# Patient Record
Sex: Female | Born: 1950
Health system: Southern US, Community
[De-identification: ages and names within clinical notes are randomized; demographics above are authoritative.]

## PROBLEM LIST (undated history)

## (undated) DIAGNOSIS — R079 Chest pain, unspecified: Secondary | ICD-10-CM

## (undated) DIAGNOSIS — E785 Hyperlipidemia, unspecified: Secondary | ICD-10-CM

## (undated) DIAGNOSIS — M1990 Unspecified osteoarthritis, unspecified site: Secondary | ICD-10-CM

## (undated) DIAGNOSIS — R0602 Shortness of breath: Secondary | ICD-10-CM

## (undated) HISTORY — PX: NASAL RECONSTRUCTION: SHX2069

## (undated) HISTORY — DX: Shortness of breath: R06.02

## (undated) HISTORY — DX: Hyperlipidemia, unspecified: E78.5

## (undated) HISTORY — PX: TONSILLECTOMY: SUR1361

## (undated) HISTORY — PX: TUBAL LIGATION: SHX77

## (undated) HISTORY — DX: Chest pain, unspecified: R07.9

## (undated) HISTORY — PX: BREAST ENHANCEMENT SURGERY: SHX7

## (undated) HISTORY — PX: ANAL FISSURE REPAIR: SHX2312

---

## 2013-01-08 DIAGNOSIS — C4492 Squamous cell carcinoma of skin, unspecified: Secondary | ICD-10-CM

## 2013-01-08 HISTORY — DX: Squamous cell carcinoma of skin, unspecified: C44.92

## 2013-09-14 DIAGNOSIS — D239 Other benign neoplasm of skin, unspecified: Secondary | ICD-10-CM

## 2013-09-14 HISTORY — DX: Other benign neoplasm of skin, unspecified: D23.9

## 2013-09-29 ENCOUNTER — Encounter: Payer: Self-pay | Admitting: Cardiology

## 2013-09-29 ENCOUNTER — Ambulatory Visit (INDEPENDENT_AMBULATORY_CARE_PROVIDER_SITE_OTHER): Payer: BC Managed Care – PPO | Admitting: Cardiology

## 2013-09-29 ENCOUNTER — Encounter (INDEPENDENT_AMBULATORY_CARE_PROVIDER_SITE_OTHER): Payer: Self-pay

## 2013-09-29 VITALS — BP 122/80 | HR 76 | Ht 67.0 in | Wt 160.0 lb

## 2013-09-29 DIAGNOSIS — Z8249 Family history of ischemic heart disease and other diseases of the circulatory system: Secondary | ICD-10-CM

## 2013-09-29 DIAGNOSIS — F17200 Nicotine dependence, unspecified, uncomplicated: Secondary | ICD-10-CM

## 2013-09-29 DIAGNOSIS — Z720 Tobacco use: Secondary | ICD-10-CM

## 2013-09-29 DIAGNOSIS — E7849 Other hyperlipidemia: Secondary | ICD-10-CM

## 2013-09-29 DIAGNOSIS — E785 Hyperlipidemia, unspecified: Secondary | ICD-10-CM

## 2013-09-29 DIAGNOSIS — E7800 Pure hypercholesterolemia, unspecified: Secondary | ICD-10-CM

## 2013-09-29 NOTE — Patient Instructions (Signed)
Your physician wants you to follow-up in: [place] will receive a reminder letter in the mail two months in advance. If you don't receive a letter, please call our office to schedule the follow-up appointment.

## 2013-09-29 NOTE — Progress Notes (Signed)
[facility]. [address], [address] [place], [place]  [address] Phone: [phone] Fax:  [phone]  Date:  09/29/2013   ID:  Monica Franklin, DOB January 05, 1951, MRN [id number]  PCP:  Gara Kroner, MD   History of Present Illness: Monica Franklin is a 63 y.o. female here for evaluation of lipids, peripheral to lipid clinic. Dr. Moreen Fowler has been working on lowering cholesterol with low cholesterol diet and despite this, her LDL has climbed to 201 up to current level of 205. He clearly explained to her that this places her at high risk of developmental plaque that could lead to heart attack or stroke. Significant genetic predisposition, likely familial hyperlipidemia. He recommends starting with 40 mg of atorvastatin. She is reluctant to use statin medication and desired consultation here.  Labs: 08/31/13-total cholesterol 283, triglycerides 151, HDL 48, LDL 205  Father died of a heart attack at age 43. Mother died at age 91 from colon cancer. Has a sister with lung cancer and brain cancer. She is a smoker, half pack per day which she has recently quit.  She denies any chest pain, shortness of breath. She exercises with yoga once twice a week, walks quite vigorously. States that she's under increased stress with her brother who has severe COPD on hospice with special needs, sister with end-stage cancer.   Wt Readings from Last 3 Encounters:  09/29/13 160 lb (72.576 kg)     No past medical history on file.  No past surgical history on file.  Current Outpatient Prescriptions  Medication Sig Dispense Refill  . ASTAXANTHIN PO Take 325 mg by mouth.      . Azelaic Acid (FINACEA) 15 % cream Apply topically 2 (two) times daily. After skin is thoroughly washed and patted dry, gently but thoroughly massage a thin film of azelaic acid cream into the affected area twice daily, in the morning and evening.      . Cholecalciferol (VITAMIN D-3) 1000 UNITS CAPS Take by mouth.      . doxycycline (VIBRAMYCIN) 50 MG  capsule       . Magnesium 500 MG CAPS Take by mouth. Magnesium L Threonate      . metroNIDAZOLE (METROCREAM) 0.75 % cream       . Omega-3 Krill Oil 500 MG CAPS Take by mouth.      . Potassium (POTASSIMIN PO) Take 500 mg by mouth.      . Ubiquinol 100 MG CAPS Take by mouth.       No current facility-administered medications for this visit.    Allergies:    Allergies  Allergen Reactions  . Sulfa Antibiotics     Social History:  The patient  reports that she quit smoking about 4 weeks ago. She does not have any smokeless tobacco history on file.   Family History  Problem Relation Age of Onset  . Heart attack Father     ROS:  Please see the history of present illness.   Denies any strokelike symptoms, bleeding, syncope, orthopnea, PND   All other systems reviewed and negative.   PHYSICAL EXAM: VS:  BP 122/80  Pulse 76  Ht 5\' 7"  (1.702 m)  Wt 160 lb (72.576 kg)  BMI 25.05 kg/m2 Well nourished, well developed, in no acute distress HEENT: normal, [place]/AT, EOMI Neck: no JVD, normal carotid upstroke, no bruit Cardiac:  normal S1, S2; RRR; no murmur Lungs:  clear to auscultation bilaterally, no wheezing, rhonchi or rales Abd: soft, nontender, no hepatomegaly,  no bruits Ext: no edema, 2+ distal pulses Skin: warm and dry GU: deferred Neuro: no focal abnormalities noted, AAO x 3  EKG:  Sinus rhythm, nonspecific ST-T wave changes. Heart rate 76. Normal intervals.     ASSESSMENT AND PLAN:  1. Familial hyperlipidemia-LDL 205. Strongly encouraged her to start statin therapy. At this point, she does not wish to pursue this. She has done research on her own, worried about risks long-term of taking statin medication. Instructed her that people with familial hyperlipidemia, LDL greater than 190 are at increased risk for stroke and heart attack. To our guidelines, statin therapy should be initiated in this special subgroup population. She wishes to once again try diet and lifestyle modification  and come back in 6 months. She was requesting lipomed profile. She understood particle size and its importance. I don't think it is unreasonable to have this test done one week prior to her 6 month followup appointment. We will order. I did state that regardless of particle size however, with an LDL count of greater than 190 we should initiate therapy. She also states that in the past she's had a nuclear stress test and it has been normal. Once again, I carefully reviewed her risks, encouraged her to take statin medication which she respectfully refuses at this time. 2. Six-month followup, [employer], lipid clinic.  Signed, Candee Furbish, MD [facility]  09/29/2013 12:06 PM

## 2014-01-18 ENCOUNTER — Other Ambulatory Visit: Payer: Self-pay | Admitting: Family Medicine

## 2014-01-18 ENCOUNTER — Ambulatory Visit
Admission: RE | Admit: 2014-01-18 | Discharge: 2014-01-18 | Disposition: A | Discharge status: Home / Self Care | Payer: BC Managed Care – PPO | Source: Ambulatory Visit | Attending: Family Medicine | Admitting: Family Medicine

## 2014-01-18 DIAGNOSIS — M25551 Pain in right hip: Secondary | ICD-10-CM

## 2014-07-16 DIAGNOSIS — S336XXA Sprain of sacroiliac joint, initial encounter: Secondary | ICD-10-CM | POA: Diagnosis not present

## 2014-07-21 DIAGNOSIS — S336XXA Sprain of sacroiliac joint, initial encounter: Secondary | ICD-10-CM | POA: Diagnosis not present

## 2014-07-28 DIAGNOSIS — S336XXA Sprain of sacroiliac joint, initial encounter: Secondary | ICD-10-CM | POA: Diagnosis not present

## 2014-08-04 DIAGNOSIS — S336XXA Sprain of sacroiliac joint, initial encounter: Secondary | ICD-10-CM | POA: Diagnosis not present

## 2014-08-18 DIAGNOSIS — S336XXA Sprain of sacroiliac joint, initial encounter: Secondary | ICD-10-CM | POA: Diagnosis not present

## 2014-08-25 DIAGNOSIS — S336XXA Sprain of sacroiliac joint, initial encounter: Secondary | ICD-10-CM | POA: Diagnosis not present

## 2014-09-01 DIAGNOSIS — S336XXA Sprain of sacroiliac joint, initial encounter: Secondary | ICD-10-CM | POA: Diagnosis not present

## 2014-09-15 DIAGNOSIS — S336XXA Sprain of sacroiliac joint, initial encounter: Secondary | ICD-10-CM | POA: Diagnosis not present

## 2014-10-13 DIAGNOSIS — S336XXA Sprain of sacroiliac joint, initial encounter: Secondary | ICD-10-CM | POA: Diagnosis not present

## 2014-11-25 DIAGNOSIS — L578 Other skin changes due to chronic exposure to nonionizing radiation: Secondary | ICD-10-CM | POA: Diagnosis not present

## 2014-11-25 DIAGNOSIS — L814 Other melanin hyperpigmentation: Secondary | ICD-10-CM | POA: Diagnosis not present

## 2014-11-25 DIAGNOSIS — L820 Inflamed seborrheic keratosis: Secondary | ICD-10-CM | POA: Diagnosis not present

## 2014-11-25 DIAGNOSIS — D485 Neoplasm of uncertain behavior of skin: Secondary | ICD-10-CM | POA: Diagnosis not present

## 2014-11-25 DIAGNOSIS — L821 Other seborrheic keratosis: Secondary | ICD-10-CM | POA: Diagnosis not present

## 2014-11-25 DIAGNOSIS — D229 Melanocytic nevi, unspecified: Secondary | ICD-10-CM | POA: Diagnosis not present

## 2014-11-25 DIAGNOSIS — Z1283 Encounter for screening for malignant neoplasm of skin: Secondary | ICD-10-CM | POA: Diagnosis not present

## 2014-11-25 DIAGNOSIS — Z85828 Personal history of other malignant neoplasm of skin: Secondary | ICD-10-CM | POA: Diagnosis not present

## 2014-11-25 DIAGNOSIS — D1800 Hemangioma unspecified site: Secondary | ICD-10-CM | POA: Diagnosis not present

## 2014-11-25 DIAGNOSIS — L718 Other rosacea: Secondary | ICD-10-CM | POA: Diagnosis not present

## 2014-12-08 DIAGNOSIS — S23110D Subluxation of T1/T2 thoracic vertebra, subsequent encounter: Secondary | ICD-10-CM | POA: Diagnosis not present

## 2014-12-08 DIAGNOSIS — M9902 Segmental and somatic dysfunction of thoracic region: Secondary | ICD-10-CM | POA: Diagnosis not present

## 2014-12-16 DIAGNOSIS — Z6826 Body mass index (BMI) 26.0-26.9, adult: Secondary | ICD-10-CM | POA: Diagnosis not present

## 2014-12-16 DIAGNOSIS — Z124 Encounter for screening for malignant neoplasm of cervix: Secondary | ICD-10-CM | POA: Diagnosis not present

## 2014-12-16 DIAGNOSIS — Z1231 Encounter for screening mammogram for malignant neoplasm of breast: Secondary | ICD-10-CM | POA: Diagnosis not present

## 2015-01-19 DIAGNOSIS — M9902 Segmental and somatic dysfunction of thoracic region: Secondary | ICD-10-CM | POA: Diagnosis not present

## 2015-01-19 DIAGNOSIS — S23110D Subluxation of T1/T2 thoracic vertebra, subsequent encounter: Secondary | ICD-10-CM | POA: Diagnosis not present

## 2015-02-23 DIAGNOSIS — S23110D Subluxation of T1/T2 thoracic vertebra, subsequent encounter: Secondary | ICD-10-CM | POA: Diagnosis not present

## 2015-02-23 DIAGNOSIS — M9902 Segmental and somatic dysfunction of thoracic region: Secondary | ICD-10-CM | POA: Diagnosis not present

## 2015-04-06 DIAGNOSIS — S23110D Subluxation of T1/T2 thoracic vertebra, subsequent encounter: Secondary | ICD-10-CM | POA: Diagnosis not present

## 2015-04-06 DIAGNOSIS — M9902 Segmental and somatic dysfunction of thoracic region: Secondary | ICD-10-CM | POA: Diagnosis not present

## 2015-05-02 DIAGNOSIS — R6889 Other general symptoms and signs: Secondary | ICD-10-CM | POA: Diagnosis not present

## 2015-05-02 DIAGNOSIS — J209 Acute bronchitis, unspecified: Secondary | ICD-10-CM | POA: Diagnosis not present

## 2015-06-02 DIAGNOSIS — I8393 Asymptomatic varicose veins of bilateral lower extremities: Secondary | ICD-10-CM | POA: Diagnosis not present

## 2015-06-02 DIAGNOSIS — L718 Other rosacea: Secondary | ICD-10-CM | POA: Diagnosis not present

## 2015-06-02 DIAGNOSIS — L821 Other seborrheic keratosis: Secondary | ICD-10-CM | POA: Diagnosis not present

## 2015-06-02 DIAGNOSIS — D1800 Hemangioma unspecified site: Secondary | ICD-10-CM | POA: Diagnosis not present

## 2015-06-02 DIAGNOSIS — I781 Nevus, non-neoplastic: Secondary | ICD-10-CM | POA: Diagnosis not present

## 2015-06-02 DIAGNOSIS — L578 Other skin changes due to chronic exposure to nonionizing radiation: Secondary | ICD-10-CM | POA: Diagnosis not present

## 2015-06-02 DIAGNOSIS — Z85828 Personal history of other malignant neoplasm of skin: Secondary | ICD-10-CM | POA: Diagnosis not present

## 2015-06-02 DIAGNOSIS — D485 Neoplasm of uncertain behavior of skin: Secondary | ICD-10-CM | POA: Diagnosis not present

## 2015-06-02 DIAGNOSIS — Z1283 Encounter for screening for malignant neoplasm of skin: Secondary | ICD-10-CM | POA: Diagnosis not present

## 2015-06-02 DIAGNOSIS — L820 Inflamed seborrheic keratosis: Secondary | ICD-10-CM | POA: Diagnosis not present

## 2015-06-02 DIAGNOSIS — D229 Melanocytic nevi, unspecified: Secondary | ICD-10-CM | POA: Diagnosis not present

## 2015-06-06 DIAGNOSIS — Z0100 Encounter for examination of eyes and vision without abnormal findings: Secondary | ICD-10-CM | POA: Diagnosis not present

## 2015-06-06 DIAGNOSIS — H2513 Age-related nuclear cataract, bilateral: Secondary | ICD-10-CM | POA: Diagnosis not present

## 2015-07-05 DIAGNOSIS — R0602 Shortness of breath: Secondary | ICD-10-CM | POA: Diagnosis not present

## 2015-07-05 DIAGNOSIS — K219 Gastro-esophageal reflux disease without esophagitis: Secondary | ICD-10-CM | POA: Diagnosis not present

## 2015-07-05 DIAGNOSIS — E785 Hyperlipidemia, unspecified: Secondary | ICD-10-CM | POA: Diagnosis not present

## 2015-07-05 DIAGNOSIS — R0789 Other chest pain: Secondary | ICD-10-CM | POA: Diagnosis not present

## 2015-07-08 ENCOUNTER — Encounter: Payer: Self-pay | Admitting: Cardiology

## 2015-07-08 ENCOUNTER — Ambulatory Visit (INDEPENDENT_AMBULATORY_CARE_PROVIDER_SITE_OTHER): Payer: Medicare Other | Admitting: Cardiology

## 2015-07-08 VITALS — BP 114/62 | HR 58 | Ht 67.0 in | Wt 169.0 lb

## 2015-07-08 DIAGNOSIS — R079 Chest pain, unspecified: Secondary | ICD-10-CM | POA: Diagnosis not present

## 2015-07-08 DIAGNOSIS — E785 Hyperlipidemia, unspecified: Secondary | ICD-10-CM

## 2015-07-08 DIAGNOSIS — Z8249 Family history of ischemic heart disease and other diseases of the circulatory system: Secondary | ICD-10-CM

## 2015-07-08 DIAGNOSIS — E7849 Other hyperlipidemia: Secondary | ICD-10-CM | POA: Insufficient documentation

## 2015-07-08 LAB — LIPID PANEL
CHOL/HDL RATIO: 6.6 ratio — AB (ref <=5.0)
CHOLESTEROL: 270 mg/dL — AB (ref 125–200)
HDL: 41 mg/dL — AB (ref >=46)
LDL Cholesterol: 197 mg/dL — ABNORMAL HIGH (ref <130)
Triglycerides: 158 mg/dL — ABNORMAL HIGH (ref <150)
VLDL: 32 mg/dL — ABNORMAL HIGH (ref <30)

## 2015-07-08 NOTE — Addendum Note (Signed)
Addended by: Eulis Foster on: 07/08/2015 09:00 AM   Modules accepted: Orders

## 2015-07-08 NOTE — Patient Instructions (Signed)
Medication Instructions:  The current medical regimen is effective;  continue present plan and medications.  Labwork: Please have blood work today (Lipid)  Testing/Procedures: Your physician has requested that you have a  myoview. For further information please visit [email or web address]. Please follow instruction sheet, as given.  You have been referred to our [facility].  Follow-Up: Follow up after testing as needed.  Thank you for choosing [facility]!!         If you need a refill on your cardiac medications before your next appointment, please call your pharmacy.

## 2015-07-08 NOTE — Progress Notes (Signed)
Cardiology Office Note    Date:  07/08/2015   ID:  Monica Franklin, DOB 1951/01/01, MRN [id number]  PCP:  Gara Kroner, MD  Cardiologist:   Candee Furbish, MD     History of Present Illness:  Monica Franklin is a 65 y.o. female here for evaluation of chest pain. She broke out into a sweat the previous weekend and felt lightheaded, fatigue and thinks that she may be showing signs of a heart attack. She had chest discomfort left-sided, left flank. 1 min duration. It was recommended that she call 911, they performed an EKG. With rest the symptoms were relieved. She has not described any specific exertional symptoms. She did reading on possible symptoms of women and heart attack.  In December around Christmas she had a cramping sensation under left knee, felt like she had popped the vein. No swelling. Cramping during yoga. No history of blood clots. Stress test was considered.   She was last seen on 09/29/13 evaluation of lipids. At that time she was working on low-cholesterol diet and despite this her LDL was 205 08/31/2013. Likely familial hyperlipidemia. She was reluctant at the time to use statin medication. Dr. Moreen Fowler recommended 40 mg of atorvastatin at the time.  Her father died of a myocardial infarction at age 7, mother died at 40 from colon cancer.  She was a smoker half pack per day. She has quit  She's not had any further chest discomfort. She does occasionally feel pounding of her heart at night. She is still reluctant to take statin medications. She has done a lot of reading on this, she states.    Past Medical History  Diagnosis Date  . Hyperlipidemia   . Chest pain   . SOB (shortness of breath)     No past surgical history on file.  Outpatient Prescriptions Prior to Visit  Medication Sig Dispense Refill  . ASTAXANTHIN PO Take 325 mg by mouth.    . doxycycline (VIBRAMYCIN) 50 MG capsule     . Magnesium 500 MG CAPS Take by mouth. Magnesium L Threonate    . Omega-3 Krill Oil  500 MG CAPS Take by mouth.    . Potassium (POTASSIMIN PO) Take 500 mg by mouth.    . Ubiquinol 100 MG CAPS Take by mouth.    . Azelaic Acid (FINACEA) 15 % cream Apply topically 2 (two) times daily. After skin is thoroughly washed and patted dry, gently but thoroughly massage a thin film of azelaic acid cream into the affected area twice daily, in the morning and evening.    . Cholecalciferol (VITAMIN D-3) 1000 UNITS CAPS Take by mouth.    . metroNIDAZOLE (METROCREAM) 0.75 % cream      No facility-administered medications prior to visit.     Allergies:   Sulfa antibiotics   Social History   Social History  . Marital Status: Married    Spouse Name: N/A  . Number of Children: N/A  . Years of Education: N/A   Social History Main Topics  . Smoking status: Former Smoker    Quit date: 08/30/2013  . Smokeless tobacco: Not on file  . Alcohol Use: Not on file  . Drug Use: Not on file  . Sexual Activity: Not on file   Other Topics Concern  . Not on file   Social History Narrative     Family History:  The patient's family history includes COPD in her brother; Colon cancer in her mother and sister; Heart attack in her  father.   ROS:   Please see the history of present illness.    ROS left flank pain, sweats, nausea All other systems reviewed and are negative.   PHYSICAL EXAM:   VS:  There were no vitals taken for this visit.   GEN: Well nourished, well developed, in no acute distress HEENT: normal Neck: no JVD, carotid bruits, or masses Cardiac: RRR; no murmurs, rubs, or gallops,no edema  Respiratory:  clear to auscultation bilaterally, normal work of breathing GI: soft, nontender, nondistended, + BS MS: no deformity or atrophy Skin: warm and dry, no rash Neuro:  Alert and Oriented x 3, Strength and sensation are intact Psych: euthymic mood, full affect  Wt Readings from Last 3 Encounters:  09/29/13 160 lb (72.576 kg)      Studies/Labs Reviewed:   EKG:  EKG is not  ordered today.  The ekg ordered from 07/05/15 shows sinus rhythm with no other significant abnormalities. Personally viewed  Previous EKG on 2015 shows sinus rhythm with mild nonspecific ST-T wave changes.  Recent Labs: No results found for requested labs within last 365 days.   Lipid Panel No results found for: CHOL, TRIG, HDL, CHOLHDL, VLDL, LDLCALC, LDLDIRECT  Additional studies/ records that were reviewed today include:  Prior office notes reviewed, EKG reviewed    ASSESSMENT:    No diagnosis found.   PLAN:  In order of problems listed above:  1. Chest pain-somewhat atypical however she did have associated diaphoresis. Because of her strong family history, hyperlipidemia, we will go ahead and pursue nuclear stress test. Her husband in [place] had bypass surgery after discovering a widow maker she states. 2. Familial hyperlipidemia-LDL 205. At last visit in 2015, we strongly encouraged use of statin therapy especially given her father's family history but she did not wish to pursue this. At that time, she had done research on her own and was worried about the long-term risks of taking statin medication. We discussed guidelines, LDL greater than 190 and use of statins. Referral was made at that time to discuss further options with our lipid clinic however she did not follow-up with this. At this point, I will repeat her lipid panel. I will once again refer her to our lipid clinic for discussion of potential ways to reduce her overall cholesterol and reduce her overall risk. She is still reluctant to take statin medications. 3. Possible GERD-PPI. 2 weeks.    Medication Adjustments/Labs and Tests Ordered: Current medicines are reviewed at length with the patient today.  Concerns regarding medicines are outlined above.  Medication changes, Labs and Tests ordered today are listed in the Patient Instructions below. There are no Patient Instructions on file for this visit.      Bobby Rumpf, MD  07/08/2015 8:11 AM    [facility] [place], [place], [place]  [address] Phone: [phone]; Fax: [phone]

## 2015-07-12 ENCOUNTER — Ambulatory Visit (INDEPENDENT_AMBULATORY_CARE_PROVIDER_SITE_OTHER): Payer: Medicare Other | Admitting: Pharmacist

## 2015-07-12 DIAGNOSIS — E785 Hyperlipidemia, unspecified: Secondary | ICD-10-CM

## 2015-07-12 DIAGNOSIS — R079 Chest pain, unspecified: Secondary | ICD-10-CM | POA: Diagnosis not present

## 2015-07-12 DIAGNOSIS — E7849 Other hyperlipidemia: Secondary | ICD-10-CM

## 2015-07-12 NOTE — Progress Notes (Signed)
Patient ID: Flordia Ficarra                 DOB: 12-May-1951                          MRN: [id number]     HPI: Alazae Sawhney is a 65 y.o. female patient referred to lipid clinic by Dr. Marlou Porch. Patient has a PMH of chest pain and hyperlipidemia. Patient has never taken any medications for hyperlipidemia. Patient is hesitant to start medication for her cholesterol as she believes she can improve it with diet and exercise.  Current Medications: none Intolerances: none Risk Factors: family history of early cardiac disease, LDL >190 LDL goal: <100  Diet: breakfast - green drink with MCT oil, lunch - leftovers from dinner, soup & salad, sandwich, dinner - mostly cooks at home, cooks with coconut oil and butter, small protein with vegetables, sometimes starchy vegetables, snacks - does like sweets but trying to cut back; eats out maybe once per week  Exercise: has gone to yoga once per week for 3 years, has a treadmill at home and tries to walk 30 minutes a few times per week  Family History: Her father died of a myocardial infarction at age 60, mother died at 58 from colon cancer.  Social History: She was a smoker half pack per day. She has quit. She reports she drinks occasionally.  Labs: 07/08/2015: TC 270, TG 158, LDL 197, HDL 41 (no medication)  Past Medical History  Diagnosis Date  . Hyperlipidemia   . Chest pain   . SOB (shortness of breath)     Current Outpatient Prescriptions on File Prior to Visit  Medication Sig Dispense Refill  . ASTAXANTHIN PO Take 325 mg by mouth daily.     . Azelaic Acid (FINACEA EX) Apply 1 application topically daily. For Rosecea    . doxycycline (VIBRAMYCIN) 50 MG capsule Take 50 mg by mouth daily as needed.     . Ivermectin (SOOLANTRA EX) Apply 1 application topically daily. For Rosacea    . Magnesium 500 MG CAPS Take 1 capsule by mouth daily. Magnesium L Threonate    . Omega-3 Krill Oil 500 MG CAPS Take by mouth.    . Potassium (POTASSIMIN PO) Take 500 mg  by mouth daily.     Marland Kitchen Ubiquinol 100 MG CAPS Take 1 tablet by mouth daily.     Marland Kitchen UNABLE TO FIND Take 1 tablet by mouth every evening. Kavinace     No current facility-administered medications on file prior to visit.    Allergies  Allergen Reactions  . Sulfa Antibiotics     Assessment/Plan:  1. Hyperlipidemia - patient is currently not taking any medications for her lipids. She continues to be very resistant to prescription medicines despite her use of many herbal medicines. She is insistent that changes in diet and exercise will lower her cholesterol. Discussed her diet and exercise at length and there is little room to improve in those areas. Discussed importance of starting medication to reduce LDL, especially given her strong family history and very elevated LDL. Discussed starting Crestor 10 mg daily - patient will think about it. She has a stress test scheduled in a week, will also check advanced cardio IQ lipid panel that day + baseline LFTs per patient request. Will f/u with pt later that week to again discuss the importance of starting lipid-lowering therapy.   Megan E. Supple, PharmD, [facility] [id number] [place], [place] [address] Phone: [phone]; Fax: [phone] 07/12/2015 11:49 AM

## 2015-07-13 ENCOUNTER — Telehealth (HOSPITAL_COMMUNITY): Payer: Self-pay | Admitting: *Deleted

## 2015-07-13 NOTE — Telephone Encounter (Signed)
Patient given detailed instructions per Myocardial Perfusion Study Information Sheet for the test on 07/18/15 at 0745. Patient notified to arrive 15 minutes early and that it is imperative to arrive on time for appointment to keep from having the test rescheduled.  If you need to cancel or reschedule your appointment, please call the office within 24 hours of your appointment. Failure to do so may result in a cancellation of your appointment, and a $50 no show fee. Patient verbalized understanding.Monica Franklin, Ranae Palms

## 2015-07-18 ENCOUNTER — Other Ambulatory Visit (INDEPENDENT_AMBULATORY_CARE_PROVIDER_SITE_OTHER): Payer: Medicare Other | Admitting: *Deleted

## 2015-07-18 ENCOUNTER — Other Ambulatory Visit: Payer: Self-pay | Admitting: Cardiology

## 2015-07-18 ENCOUNTER — Ambulatory Visit (HOSPITAL_COMMUNITY): Payer: Medicare Other | Attending: Cardiovascular Disease

## 2015-07-18 VITALS — Ht 67.0 in | Wt 169.0 lb

## 2015-07-18 DIAGNOSIS — R9439 Abnormal result of other cardiovascular function study: Secondary | ICD-10-CM | POA: Insufficient documentation

## 2015-07-18 DIAGNOSIS — Z8249 Family history of ischemic heart disease and other diseases of the circulatory system: Secondary | ICD-10-CM | POA: Insufficient documentation

## 2015-07-18 DIAGNOSIS — R079 Chest pain, unspecified: Secondary | ICD-10-CM | POA: Insufficient documentation

## 2015-07-18 DIAGNOSIS — R61 Generalized hyperhidrosis: Secondary | ICD-10-CM | POA: Diagnosis not present

## 2015-07-18 DIAGNOSIS — E785 Hyperlipidemia, unspecified: Secondary | ICD-10-CM

## 2015-07-18 DIAGNOSIS — R002 Palpitations: Secondary | ICD-10-CM | POA: Diagnosis not present

## 2015-07-18 DIAGNOSIS — R42 Dizziness and giddiness: Secondary | ICD-10-CM | POA: Insufficient documentation

## 2015-07-18 DIAGNOSIS — E7849 Other hyperlipidemia: Secondary | ICD-10-CM

## 2015-07-18 LAB — MYOCARDIAL PERFUSION IMAGING
CHL CUP NUCLEAR SRS: 0
CSEPED: 6 min
CSEPED: 6 min
CSEPEDS: 1 s
CSEPEW: 7 METS
CSEPPHR: 144 {beats}/min
Estimated workload: 7 METS
Exercise duration (sec): 1 s
LV dias vol: 98 mL
LV sys vol: 50 mL
MPHR: 155 {beats}/min
MPHR: 155 {beats}/min
NUC STRESS TID: 1.06
Peak HR: 144 {beats}/min
Percent HR: 92 %
Percent HR: 92 %
RATE: 0.28
Rest HR: 74 {beats}/min
Rest HR: 74 {beats}/min
SDS: 5
SSS: 5

## 2015-07-18 LAB — HEPATIC FUNCTION PANEL
ALK PHOS: 46 U/L (ref 33–130)
ALT: 29 U/L (ref 6–29)
AST: 25 U/L (ref 10–35)
Albumin: 4 g/dL (ref 3.6–5.1)
Bilirubin, Direct: 0.1 mg/dL (ref <=0.2)
Indirect Bilirubin: 0.2 mg/dL (ref 0.2–1.2)
Total Bilirubin: 0.3 mg/dL (ref 0.2–1.2)
Total Protein: 6.7 g/dL (ref 6.1–8.1)

## 2015-07-18 MED ORDER — TECHNETIUM TC 99M SESTAMIBI GENERIC - CARDIOLITE
33.0000 | Freq: Once | INTRAVENOUS | Status: AC | PRN
  Administered 2015-07-18: 33 via INTRAVENOUS

## 2015-07-18 MED ORDER — TECHNETIUM TC 99M SESTAMIBI GENERIC - CARDIOLITE
10.4000 | Freq: Once | INTRAVENOUS | Status: AC | PRN
  Administered 2015-07-18: 10 via INTRAVENOUS

## 2015-07-20 ENCOUNTER — Telehealth: Payer: Self-pay | Admitting: Cardiology

## 2015-07-20 NOTE — Telephone Encounter (Signed)
Spoke with pt and informed her of stress test results. Pt verbalized understanding. 

## 2015-07-20 NOTE — Telephone Encounter (Signed)
Pt said she was returning a call from yesterday,concerning her stress test results.

## 2015-07-21 LAB — CARDIO IQ(R) ADVANCED LIPID PANEL
APOLIPOPROTEIN (CARDIO IQ ADV LIPID PANEL): 159 mg/dL — AB (ref 49–103)
Cholesterol, Total: 271 mg/dL — ABNORMAL HIGH (ref 125–200)
Cholesterol/HDL Ratio: 6.3 calc — ABNORMAL HIGH (ref <=5.0)
HDL Cholesterol: 43 mg/dL — ABNORMAL LOW (ref >=46)
LDL LARGE: 5358 nmol/L (ref 5038–17886)
LDL MEDIUM: 635 nmol/L — AB (ref 121–397)
LDL Particle Number: 2292 nmol/L — ABNORMAL HIGH (ref 1016–2185)
LDL Peak Size: 214.4 Angstrom — ABNORMAL LOW (ref >=218.2)
LDL SMALL: 561 nmol/L — AB (ref 115–386)
LDL, Calculated: 203 mg/dL — ABNORMAL HIGH
LIPOPROTEIN (A) (CARDIO IQ ADV LIPID PANEL): 32 nmol/L (ref <75)
NON-HDL CHOLESTEROL (CARDIO IQ ADV LIPID PANEL): 228 mg/dL
Triglycerides: 127 mg/dL

## 2015-07-22 ENCOUNTER — Ambulatory Visit: Payer: Medicare Other | Admitting: Pharmacist

## 2015-07-25 ENCOUNTER — Ambulatory Visit (INDEPENDENT_AMBULATORY_CARE_PROVIDER_SITE_OTHER): Payer: Medicare Other | Admitting: Pharmacist

## 2015-07-25 DIAGNOSIS — Z8249 Family history of ischemic heart disease and other diseases of the circulatory system: Secondary | ICD-10-CM | POA: Diagnosis not present

## 2015-07-25 DIAGNOSIS — E785 Hyperlipidemia, unspecified: Secondary | ICD-10-CM | POA: Diagnosis not present

## 2015-07-25 DIAGNOSIS — E7849 Other hyperlipidemia: Secondary | ICD-10-CM

## 2015-07-25 MED ORDER — ROSUVASTATIN CALCIUM 10 MG PO TABS: 10.0000 mg | ORAL_TABLET | Freq: Every day | ORAL | Status: DC

## 2015-07-25 NOTE — Progress Notes (Signed)
Patient ID: Monica Franklin                 DOB: 09-20-1950                         MRN: [id number]     HPI: Monica Franklin is a 65 y.o. female patient referred to lipid clinic by Dr. Marlou Porch who presents today for follow up. Patient has a PMH of chest pain and hyperlipidemia. Patient has never taken any medications for hyperlipidemia. She has been extremely hesitant to start medication for her cholesterol as she believes she can improve it with diet and exercise. At last visit, patient requested baseline LFTs and advanced cardio IQ before she would start any medications.   Patient had a stress test on 07/12/15. Results showed a small defect of mild severity in the basal inferior and mid inferior location. No ischemia was noted. Left ventricular systolic function was normal. LV cavity size was normal. Nuclear stress EF: 50%. Overall low risk.  Current Medications: none Intolerances: none Risk Factors: family history of early cardiac disease, LDL >190 LDL goal: 100mg /dL  Diet: breakfast - green drink with MCT oil, lunch - leftovers from dinner, soup & salad, sandwich, dinner - mostly cooks at home, cooks with coconut oil and butter, small protein with vegetables, sometimes starchy vegetables, snacks - does like sweets but trying to cut back; eats out maybe once per week  Exercise: has gone to yoga once per week for 3 years, has a treadmill at home and tries to walk 30 minutes a few times per week  Family History: Her father died of a myocardial infarction at age 17, mother died at 34 from colon cancer.  Social History: She was a smoker half pack per day. She has quit. She reports she drinks occasionally.  Labs:  07/18/15 Cardio IQ: LDL-p 2292, Apo B 159, Lp (a) 32, TC 271, TG 127, HDL 43, LDL 203, LFTs wln (no medication)  Past Medical History  Diagnosis Date  . Hyperlipidemia   . Chest pain   . SOB (shortness of breath)     Current Outpatient Prescriptions on File Prior to Visit  Medication  Sig Dispense Refill  . ASTAXANTHIN PO Take 325 mg by mouth daily.     . Azelaic Acid (FINACEA EX) Apply 1 application topically daily. For Rosecea    . doxycycline (VIBRAMYCIN) 50 MG capsule Take 50 mg by mouth daily as needed.     . Ivermectin (SOOLANTRA EX) Apply 1 application topically daily. For Rosacea    . Magnesium 500 MG CAPS Take 1 capsule by mouth daily. Magnesium L Threonate    . Omega-3 Krill Oil 500 MG CAPS Take by mouth.    . Potassium (POTASSIMIN PO) Take 500 mg by mouth daily.     Marland Kitchen Ubiquinol 100 MG CAPS Take 1 tablet by mouth daily.     Marland Kitchen UNABLE TO FIND Take 1 tablet by mouth every evening. Kavinace    . UNABLE TO FIND Med Name: hemp oil     No current facility-administered medications on file prior to visit.    Allergies  Allergen Reactions  . Sulfa Antibiotics     Assessment/Plan:  1. Hyperlipidemia - LDL > 200mg /dL with pt on no medications for her cholesterol - has resisted starting statin medication multiple times in the past. Goal 100mg /dL given strong family history of cardiac disease. Discussed results of her cardio IQ test and LFTs from last  week. Pt is finally agreeable to starting a lower dose statin. Will initiate Crestor 10mg  daily and f/u with lipid panel and LFTs in 3 months.    Lorenzo Arscott E. Merranda Bolls, PharmD, [place] [id number] [place], [place] [address] Phone: [phone]; Fax: [phone] 07/25/2015 11:37 AM

## 2015-07-25 NOTE — Patient Instructions (Signed)
Pick up your prescription for rosuvastatin (Crestor) 10mg  to start taking once a day We will check your liver and cholesterol again in 3 months on Monday, May 22. Lab opens at 7:30am, come in any time after for fasting lab work.

## 2015-07-26 NOTE — Progress Notes (Signed)
Dr. Skains patient 

## 2015-10-17 ENCOUNTER — Other Ambulatory Visit (INDEPENDENT_AMBULATORY_CARE_PROVIDER_SITE_OTHER): Payer: Medicare Other

## 2015-10-17 DIAGNOSIS — E785 Hyperlipidemia, unspecified: Secondary | ICD-10-CM

## 2015-10-17 DIAGNOSIS — E7849 Other hyperlipidemia: Secondary | ICD-10-CM

## 2015-10-17 LAB — HEPATIC FUNCTION PANEL
ALK PHOS: 51 U/L (ref 33–130)
ALT: 19 U/L (ref 6–29)
AST: 19 U/L (ref 10–35)
Albumin: 4.5 g/dL (ref 3.6–5.1)
BILIRUBIN INDIRECT: 0.3 mg/dL (ref 0.2–1.2)
BILIRUBIN TOTAL: 0.4 mg/dL (ref 0.2–1.2)
Bilirubin, Direct: 0.1 mg/dL (ref <=0.2)
TOTAL PROTEIN: 6.7 g/dL (ref 6.1–8.1)

## 2015-10-17 LAB — LIPID PANEL
Cholesterol: 165 mg/dL (ref 125–200)
HDL: 49 mg/dL (ref >=46)
LDL CALC: 92 mg/dL (ref <130)
TRIGLYCERIDES: 120 mg/dL (ref <150)
Total CHOL/HDL Ratio: 3.4 Ratio (ref <=5.0)
VLDL: 24 mg/dL (ref <30)

## 2015-10-19 ENCOUNTER — Other Ambulatory Visit: Payer: Self-pay | Admitting: Pharmacist

## 2015-10-19 DIAGNOSIS — E7849 Other hyperlipidemia: Secondary | ICD-10-CM

## 2015-11-04 DIAGNOSIS — S233XXA Sprain of ligaments of thoracic spine, initial encounter: Secondary | ICD-10-CM | POA: Diagnosis not present

## 2015-11-04 DIAGNOSIS — M9901 Segmental and somatic dysfunction of cervical region: Secondary | ICD-10-CM | POA: Diagnosis not present

## 2015-11-09 DIAGNOSIS — S233XXA Sprain of ligaments of thoracic spine, initial encounter: Secondary | ICD-10-CM | POA: Diagnosis not present

## 2015-11-09 DIAGNOSIS — M9901 Segmental and somatic dysfunction of cervical region: Secondary | ICD-10-CM | POA: Diagnosis not present

## 2015-11-11 DIAGNOSIS — M9901 Segmental and somatic dysfunction of cervical region: Secondary | ICD-10-CM | POA: Diagnosis not present

## 2015-11-11 DIAGNOSIS — S233XXA Sprain of ligaments of thoracic spine, initial encounter: Secondary | ICD-10-CM | POA: Diagnosis not present

## 2015-11-16 DIAGNOSIS — S233XXA Sprain of ligaments of thoracic spine, initial encounter: Secondary | ICD-10-CM | POA: Diagnosis not present

## 2015-11-16 DIAGNOSIS — M9901 Segmental and somatic dysfunction of cervical region: Secondary | ICD-10-CM | POA: Diagnosis not present

## 2015-11-18 DIAGNOSIS — M9901 Segmental and somatic dysfunction of cervical region: Secondary | ICD-10-CM | POA: Diagnosis not present

## 2015-11-18 DIAGNOSIS — S233XXA Sprain of ligaments of thoracic spine, initial encounter: Secondary | ICD-10-CM | POA: Diagnosis not present

## 2015-11-21 DIAGNOSIS — M9901 Segmental and somatic dysfunction of cervical region: Secondary | ICD-10-CM | POA: Diagnosis not present

## 2015-11-21 DIAGNOSIS — S233XXA Sprain of ligaments of thoracic spine, initial encounter: Secondary | ICD-10-CM | POA: Diagnosis not present

## 2015-11-23 DIAGNOSIS — S233XXA Sprain of ligaments of thoracic spine, initial encounter: Secondary | ICD-10-CM | POA: Diagnosis not present

## 2015-11-23 DIAGNOSIS — M9901 Segmental and somatic dysfunction of cervical region: Secondary | ICD-10-CM | POA: Diagnosis not present

## 2015-11-25 DIAGNOSIS — M9901 Segmental and somatic dysfunction of cervical region: Secondary | ICD-10-CM | POA: Diagnosis not present

## 2015-11-25 DIAGNOSIS — S233XXA Sprain of ligaments of thoracic spine, initial encounter: Secondary | ICD-10-CM | POA: Diagnosis not present

## 2015-11-30 DIAGNOSIS — M9901 Segmental and somatic dysfunction of cervical region: Secondary | ICD-10-CM | POA: Diagnosis not present

## 2015-11-30 DIAGNOSIS — S233XXA Sprain of ligaments of thoracic spine, initial encounter: Secondary | ICD-10-CM | POA: Diagnosis not present

## 2015-12-02 DIAGNOSIS — M9901 Segmental and somatic dysfunction of cervical region: Secondary | ICD-10-CM | POA: Diagnosis not present

## 2015-12-02 DIAGNOSIS — S233XXA Sprain of ligaments of thoracic spine, initial encounter: Secondary | ICD-10-CM | POA: Diagnosis not present

## 2015-12-07 DIAGNOSIS — M9901 Segmental and somatic dysfunction of cervical region: Secondary | ICD-10-CM | POA: Diagnosis not present

## 2015-12-07 DIAGNOSIS — S233XXA Sprain of ligaments of thoracic spine, initial encounter: Secondary | ICD-10-CM | POA: Diagnosis not present

## 2015-12-09 DIAGNOSIS — M9901 Segmental and somatic dysfunction of cervical region: Secondary | ICD-10-CM | POA: Diagnosis not present

## 2015-12-09 DIAGNOSIS — S233XXA Sprain of ligaments of thoracic spine, initial encounter: Secondary | ICD-10-CM | POA: Diagnosis not present

## 2015-12-14 DIAGNOSIS — M67912 Unspecified disorder of synovium and tendon, left shoulder: Secondary | ICD-10-CM | POA: Diagnosis not present

## 2015-12-15 DIAGNOSIS — S233XXA Sprain of ligaments of thoracic spine, initial encounter: Secondary | ICD-10-CM | POA: Diagnosis not present

## 2015-12-15 DIAGNOSIS — M9901 Segmental and somatic dysfunction of cervical region: Secondary | ICD-10-CM | POA: Diagnosis not present

## 2015-12-16 DIAGNOSIS — M25512 Pain in left shoulder: Secondary | ICD-10-CM | POA: Diagnosis not present

## 2015-12-21 DIAGNOSIS — M25512 Pain in left shoulder: Secondary | ICD-10-CM | POA: Diagnosis not present

## 2015-12-26 DIAGNOSIS — M25512 Pain in left shoulder: Secondary | ICD-10-CM | POA: Diagnosis not present

## 2015-12-28 DIAGNOSIS — M9901 Segmental and somatic dysfunction of cervical region: Secondary | ICD-10-CM | POA: Diagnosis not present

## 2015-12-28 DIAGNOSIS — S233XXA Sprain of ligaments of thoracic spine, initial encounter: Secondary | ICD-10-CM | POA: Diagnosis not present

## 2016-01-04 DIAGNOSIS — M9901 Segmental and somatic dysfunction of cervical region: Secondary | ICD-10-CM | POA: Diagnosis not present

## 2016-01-04 DIAGNOSIS — S233XXA Sprain of ligaments of thoracic spine, initial encounter: Secondary | ICD-10-CM | POA: Diagnosis not present

## 2016-01-06 DIAGNOSIS — M9901 Segmental and somatic dysfunction of cervical region: Secondary | ICD-10-CM | POA: Diagnosis not present

## 2016-01-06 DIAGNOSIS — S233XXA Sprain of ligaments of thoracic spine, initial encounter: Secondary | ICD-10-CM | POA: Diagnosis not present

## 2016-01-11 DIAGNOSIS — S233XXA Sprain of ligaments of thoracic spine, initial encounter: Secondary | ICD-10-CM | POA: Diagnosis not present

## 2016-01-11 DIAGNOSIS — M9901 Segmental and somatic dysfunction of cervical region: Secondary | ICD-10-CM | POA: Diagnosis not present

## 2016-01-18 DIAGNOSIS — M9901 Segmental and somatic dysfunction of cervical region: Secondary | ICD-10-CM | POA: Diagnosis not present

## 2016-01-18 DIAGNOSIS — S233XXA Sprain of ligaments of thoracic spine, initial encounter: Secondary | ICD-10-CM | POA: Diagnosis not present

## 2016-01-31 DIAGNOSIS — H6121 Impacted cerumen, right ear: Secondary | ICD-10-CM | POA: Diagnosis not present

## 2016-02-01 DIAGNOSIS — S233XXA Sprain of ligaments of thoracic spine, initial encounter: Secondary | ICD-10-CM | POA: Diagnosis not present

## 2016-02-01 DIAGNOSIS — M9901 Segmental and somatic dysfunction of cervical region: Secondary | ICD-10-CM | POA: Diagnosis not present

## 2016-02-02 DIAGNOSIS — H6061 Unspecified chronic otitis externa, right ear: Secondary | ICD-10-CM | POA: Diagnosis not present

## 2016-02-02 DIAGNOSIS — H6121 Impacted cerumen, right ear: Secondary | ICD-10-CM | POA: Diagnosis not present

## 2016-02-02 DIAGNOSIS — S233XXA Sprain of ligaments of thoracic spine, initial encounter: Secondary | ICD-10-CM | POA: Diagnosis not present

## 2016-02-02 DIAGNOSIS — M9901 Segmental and somatic dysfunction of cervical region: Secondary | ICD-10-CM | POA: Diagnosis not present

## 2016-02-03 DIAGNOSIS — M25512 Pain in left shoulder: Secondary | ICD-10-CM | POA: Diagnosis not present

## 2016-02-13 DIAGNOSIS — M25512 Pain in left shoulder: Secondary | ICD-10-CM | POA: Diagnosis not present

## 2016-02-15 DIAGNOSIS — S233XXA Sprain of ligaments of thoracic spine, initial encounter: Secondary | ICD-10-CM | POA: Diagnosis not present

## 2016-02-15 DIAGNOSIS — M9901 Segmental and somatic dysfunction of cervical region: Secondary | ICD-10-CM | POA: Diagnosis not present

## 2016-02-22 DIAGNOSIS — S233XXA Sprain of ligaments of thoracic spine, initial encounter: Secondary | ICD-10-CM | POA: Diagnosis not present

## 2016-02-22 DIAGNOSIS — M9901 Segmental and somatic dysfunction of cervical region: Secondary | ICD-10-CM | POA: Diagnosis not present

## 2016-02-24 DIAGNOSIS — M75112 Incomplete rotator cuff tear or rupture of left shoulder, not specified as traumatic: Secondary | ICD-10-CM | POA: Diagnosis not present

## 2016-02-29 ENCOUNTER — Other Ambulatory Visit: Payer: Self-pay | Admitting: Orthopedic Surgery

## 2016-02-29 DIAGNOSIS — S233XXA Sprain of ligaments of thoracic spine, initial encounter: Secondary | ICD-10-CM | POA: Diagnosis not present

## 2016-02-29 DIAGNOSIS — M9901 Segmental and somatic dysfunction of cervical region: Secondary | ICD-10-CM | POA: Diagnosis not present

## 2016-03-07 DIAGNOSIS — S233XXA Sprain of ligaments of thoracic spine, initial encounter: Secondary | ICD-10-CM | POA: Diagnosis not present

## 2016-03-07 DIAGNOSIS — M9901 Segmental and somatic dysfunction of cervical region: Secondary | ICD-10-CM | POA: Diagnosis not present

## 2016-03-13 ENCOUNTER — Encounter (HOSPITAL_BASED_OUTPATIENT_CLINIC_OR_DEPARTMENT_OTHER): Payer: Self-pay | Admitting: *Deleted

## 2016-03-16 DIAGNOSIS — S233XXA Sprain of ligaments of thoracic spine, initial encounter: Secondary | ICD-10-CM | POA: Diagnosis not present

## 2016-03-16 DIAGNOSIS — M9901 Segmental and somatic dysfunction of cervical region: Secondary | ICD-10-CM | POA: Diagnosis not present

## 2016-03-19 ENCOUNTER — Ambulatory Visit (HOSPITAL_BASED_OUTPATIENT_CLINIC_OR_DEPARTMENT_OTHER): Payer: Medicare Other | Admitting: Anesthesiology

## 2016-03-19 ENCOUNTER — Ambulatory Visit (HOSPITAL_BASED_OUTPATIENT_CLINIC_OR_DEPARTMENT_OTHER)
Admission: RE | Admit: 2016-03-19 | Discharge: 2016-03-19 | Disposition: A | Discharge status: Home / Self Care | Payer: Medicare Other | Source: Ambulatory Visit | Attending: Orthopedic Surgery | Admitting: Orthopedic Surgery

## 2016-03-19 ENCOUNTER — Encounter (HOSPITAL_BASED_OUTPATIENT_CLINIC_OR_DEPARTMENT_OTHER)
Admission: RE | Disposition: A | Discharge status: Home / Self Care | Payer: Self-pay | Source: Ambulatory Visit | Attending: Orthopedic Surgery

## 2016-03-19 ENCOUNTER — Encounter (HOSPITAL_BASED_OUTPATIENT_CLINIC_OR_DEPARTMENT_OTHER): Payer: Self-pay | Admitting: Anesthesiology

## 2016-03-19 DIAGNOSIS — M65812 Other synovitis and tenosynovitis, left shoulder: Secondary | ICD-10-CM | POA: Insufficient documentation

## 2016-03-19 DIAGNOSIS — M7542 Impingement syndrome of left shoulder: Secondary | ICD-10-CM | POA: Insufficient documentation

## 2016-03-19 DIAGNOSIS — Z882 Allergy status to sulfonamides status: Secondary | ICD-10-CM | POA: Diagnosis not present

## 2016-03-19 DIAGNOSIS — M75112 Incomplete rotator cuff tear or rupture of left shoulder, not specified as traumatic: Secondary | ICD-10-CM | POA: Insufficient documentation

## 2016-03-19 DIAGNOSIS — E785 Hyperlipidemia, unspecified: Secondary | ICD-10-CM | POA: Insufficient documentation

## 2016-03-19 DIAGNOSIS — M19031 Primary osteoarthritis, right wrist: Secondary | ICD-10-CM | POA: Insufficient documentation

## 2016-03-19 DIAGNOSIS — M19032 Primary osteoarthritis, left wrist: Secondary | ICD-10-CM | POA: Insufficient documentation

## 2016-03-19 DIAGNOSIS — G8918 Other acute postprocedural pain: Secondary | ICD-10-CM | POA: Diagnosis not present

## 2016-03-19 DIAGNOSIS — M75122 Complete rotator cuff tear or rupture of left shoulder, not specified as traumatic: Secondary | ICD-10-CM | POA: Diagnosis not present

## 2016-03-19 DIAGNOSIS — M75102 Unspecified rotator cuff tear or rupture of left shoulder, not specified as traumatic: Secondary | ICD-10-CM | POA: Diagnosis not present

## 2016-03-19 DIAGNOSIS — Z87891 Personal history of nicotine dependence: Secondary | ICD-10-CM | POA: Diagnosis not present

## 2016-03-19 DIAGNOSIS — M25512 Pain in left shoulder: Secondary | ICD-10-CM | POA: Diagnosis not present

## 2016-03-19 HISTORY — DX: Unspecified osteoarthritis, unspecified site: M19.90

## 2016-03-19 HISTORY — PX: SHOULDER ARTHROSCOPY WITH ROTATOR CUFF REPAIR AND SUBACROMIAL DECOMPRESSION: SHX5686

## 2016-03-19 SURGERY — SHOULDER ARTHROSCOPY WITH ROTATOR CUFF REPAIR AND SUBACROMIAL DECOMPRESSION
Anesthesia: General | Site: Shoulder | Laterality: Left

## 2016-03-19 MED ORDER — FENTANYL CITRATE (PF) 100 MCG/2ML IJ SOLN
INTRAMUSCULAR | Status: AC
  Filled 2016-03-19: qty 2

## 2016-03-19 MED ORDER — CEFAZOLIN SODIUM-DEXTROSE 2-4 GM/100ML-% IV SOLN
2.0000 g | INTRAVENOUS | Status: AC
  Administered 2016-03-19: 2 g via INTRAVENOUS

## 2016-03-19 MED ORDER — SUCCINYLCHOLINE CHLORIDE 20 MG/ML IJ SOLN
INTRAMUSCULAR | Status: DC | PRN
  Administered 2016-03-19: 120 mg via INTRAVENOUS

## 2016-03-19 MED ORDER — SODIUM CHLORIDE 0.9 % IR SOLN
Status: DC | PRN
  Administered 2016-03-19: 6000 mL

## 2016-03-19 MED ORDER — LACTATED RINGERS IV SOLN
INTRAVENOUS | Status: DC
  Administered 2016-03-19: 13:00:00 via INTRAVENOUS

## 2016-03-19 MED ORDER — FENTANYL CITRATE (PF) 100 MCG/2ML IJ SOLN
25.0000 ug | INTRAMUSCULAR | Status: DC | PRN
Start: 2016-03-19 — End: 2016-03-19

## 2016-03-19 MED ORDER — MIDAZOLAM HCL 2 MG/2ML IJ SOLN
INTRAMUSCULAR | Status: AC
  Filled 2016-03-19: qty 2

## 2016-03-19 MED ORDER — GLYCOPYRROLATE 0.2 MG/ML IJ SOLN: 0.2000 mg | Freq: Once | INTRAMUSCULAR | Status: DC | PRN

## 2016-03-19 MED ORDER — POVIDONE-IODINE 7.5 % EX SOLN: Freq: Once | CUTANEOUS | Status: DC

## 2016-03-19 MED ORDER — OXYCODONE-ACETAMINOPHEN 5-325 MG PO TABS: 1.0000 | ORAL_TABLET | ORAL | 0 refills | Status: AC | PRN

## 2016-03-19 MED ORDER — BUPIVACAINE-EPINEPHRINE (PF) 0.5% -1:200000 IJ SOLN
INTRAMUSCULAR | Status: DC | PRN
  Administered 2016-03-19: 25 mL

## 2016-03-19 MED ORDER — PROMETHAZINE HCL 25 MG/ML IJ SOLN: 6.2500 mg | INTRAMUSCULAR | Status: DC | PRN

## 2016-03-19 MED ORDER — EPHEDRINE SULFATE 50 MG/ML IJ SOLN
INTRAMUSCULAR | Status: DC | PRN
  Administered 2016-03-19: 15 mg via INTRAVENOUS

## 2016-03-19 MED ORDER — CEFAZOLIN SODIUM-DEXTROSE 2-4 GM/100ML-% IV SOLN
INTRAVENOUS | Status: AC
  Filled 2016-03-19: qty 100

## 2016-03-19 MED ORDER — SCOPOLAMINE 1 MG/3DAYS TD PT72: 1.0000 | MEDICATED_PATCH | Freq: Once | TRANSDERMAL | Status: DC | PRN

## 2016-03-19 MED ORDER — MIDAZOLAM HCL 2 MG/2ML IJ SOLN
1.0000 mg | INTRAMUSCULAR | Status: DC | PRN
  Administered 2016-03-19 (×2): 1 mg via INTRAVENOUS

## 2016-03-19 MED ORDER — PROPOFOL 10 MG/ML IV BOLUS
INTRAVENOUS | Status: DC | PRN
  Administered 2016-03-19: 150 mg via INTRAVENOUS

## 2016-03-19 MED ORDER — ONDANSETRON HCL 4 MG/2ML IJ SOLN
INTRAMUSCULAR | Status: DC | PRN
Start: 2016-03-19 — End: 2016-03-19
  Administered 2016-03-19: 4 mg via INTRAVENOUS

## 2016-03-19 MED ORDER — PHENYLEPHRINE HCL 10 MG/ML IJ SOLN
INTRAVENOUS | Status: DC | PRN
  Administered 2016-03-19: 40 ug/min via INTRAVENOUS

## 2016-03-19 MED ORDER — DEXAMETHASONE SODIUM PHOSPHATE 4 MG/ML IJ SOLN
INTRAMUSCULAR | Status: DC | PRN
  Administered 2016-03-19: 10 mg via INTRAVENOUS

## 2016-03-19 MED ORDER — DOCUSATE SODIUM 100 MG PO CAPS: 100.0000 mg | ORAL_CAPSULE | Freq: Three times a day (TID) | ORAL | 0 refills | Status: AC | PRN

## 2016-03-19 MED ORDER — FENTANYL CITRATE (PF) 100 MCG/2ML IJ SOLN
50.0000 ug | INTRAMUSCULAR | Status: DC | PRN
  Administered 2016-03-19 (×2): 50 ug via INTRAVENOUS

## 2016-03-19 SURGICAL SUPPLY — 82 items
ANCHOR PEEK 4.75X19.1 SWLK C (Anchor) ×8 IMPLANT
BENZOIN TINCTURE PRP APPL 2/3 (GAUZE/BANDAGES/DRESSINGS) IMPLANT
BLADE CLIPPER SURG (BLADE) IMPLANT
BLADE SURG 15 STRL LF DISP TIS (BLADE) IMPLANT
BLADE SURG 15 STRL SS (BLADE)
BUR OVAL 4.0 (BURR) ×4 IMPLANT
CANNULA 5.75X71 LONG (CANNULA) ×4 IMPLANT
CANNULA TWIST IN 8.25X7CM (CANNULA) ×4 IMPLANT
CHLORAPREP W/TINT 26ML (MISCELLANEOUS) ×4 IMPLANT
CLOSURE WOUND 1/2 X4 (GAUZE/BANDAGES/DRESSINGS)
DECANTER SPIKE VIAL GLASS SM (MISCELLANEOUS) IMPLANT
DERMABOND ADVANCED (GAUZE/BANDAGES/DRESSINGS)
DERMABOND ADVANCED .7 DNX12 (GAUZE/BANDAGES/DRESSINGS) IMPLANT
DRAPE IMP U-DRAPE 54X76 (DRAPES) ×4 IMPLANT
DRAPE INCISE IOBAN 66X45 STRL (DRAPES) ×4 IMPLANT
DRAPE STERI 35X30 U-POUCH (DRAPES) ×4 IMPLANT
DRAPE SURG 17X23 STRL (DRAPES) ×4 IMPLANT
DRAPE U-SHAPE 47X51 STRL (DRAPES) ×4 IMPLANT
DRAPE U-SHAPE 76X120 STRL (DRAPES) ×8 IMPLANT
DRSG PAD ABDOMINAL 8X10 ST (GAUZE/BANDAGES/DRESSINGS) ×4 IMPLANT
ELECT REM PT RETURN 9FT ADLT (ELECTROSURGICAL)
ELECTRODE REM PT RTRN 9FT ADLT (ELECTROSURGICAL) IMPLANT
GAUZE SPONGE 4X4 12PLY STRL (GAUZE/BANDAGES/DRESSINGS) ×4 IMPLANT
GAUZE SPONGE 4X4 16PLY XRAY LF (GAUZE/BANDAGES/DRESSINGS) IMPLANT
GAUZE XEROFORM 1X8 LF (GAUZE/BANDAGES/DRESSINGS) ×4 IMPLANT
GLOVE BIO SURGEON STRL SZ7 (GLOVE) ×4 IMPLANT
GLOVE BIO SURGEON STRL SZ7.5 (GLOVE) ×4 IMPLANT
GLOVE BIOGEL M 7.0 STRL (GLOVE) ×4 IMPLANT
GLOVE BIOGEL PI IND STRL 7.0 (GLOVE) ×2 IMPLANT
GLOVE BIOGEL PI IND STRL 8 (GLOVE) ×4 IMPLANT
GLOVE BIOGEL PI INDICATOR 7.0 (GLOVE) ×2
GLOVE BIOGEL PI INDICATOR 8 (GLOVE) ×4
GLOVE ECLIPSE 6.5 STRL STRAW (GLOVE) ×4 IMPLANT
GOWN STRL REUS W/ TWL LRG LVL3 (GOWN DISPOSABLE) ×4 IMPLANT
GOWN STRL REUS W/ TWL XL LVL3 (GOWN DISPOSABLE) ×2 IMPLANT
GOWN STRL REUS W/TWL LRG LVL3 (GOWN DISPOSABLE) ×4
GOWN STRL REUS W/TWL XL LVL3 (GOWN DISPOSABLE) ×2
LASSO CRESCENT QUICKPASS (SUTURE) IMPLANT
MANIFOLD NEPTUNE II (INSTRUMENTS) ×4 IMPLANT
NDL SUT 6 .5 CRC .975X.05 MAYO (NEEDLE) IMPLANT
NEEDLE 1/2 CIR CATGUT .05X1.09 (NEEDLE) IMPLANT
NEEDLE MAYO TAPER (NEEDLE)
NEEDLE SCORPION MULTI FIRE (NEEDLE) ×4 IMPLANT
NS IRRIG 1000ML POUR BTL (IV SOLUTION) IMPLANT
PACK ARTHROSCOPY DSU (CUSTOM PROCEDURE TRAY) ×4 IMPLANT
PACK BASIN DAY SURGERY FS (CUSTOM PROCEDURE TRAY) ×4 IMPLANT
PENCIL BUTTON HOLSTER BLD 10FT (ELECTRODE) IMPLANT
PROBE BIPOLAR ATHRO 135MM 90D (MISCELLANEOUS) ×4 IMPLANT
RESECTOR FULL RADIUS 4.2MM (BLADE) ×4 IMPLANT
SHEET MEDIUM DRAPE 40X70 STRL (DRAPES) IMPLANT
SLEEVE SCD COMPRESS KNEE MED (MISCELLANEOUS) ×4 IMPLANT
SLING ARM FOAM STRAP LRG (SOFTGOODS) IMPLANT
SLING ARM IMMOBILIZER MED (SOFTGOODS) ×8 IMPLANT
SLING ARM MED ADULT FOAM STRAP (SOFTGOODS) IMPLANT
SLING ARM XL FOAM STRAP (SOFTGOODS) IMPLANT
SPONGE LAP 4X18 X RAY DECT (DISPOSABLE) IMPLANT
STRIP CLOSURE SKIN 1/2X4 (GAUZE/BANDAGES/DRESSINGS) IMPLANT
SUCTION FRAZIER HANDLE 10FR (MISCELLANEOUS)
SUCTION TUBE FRAZIER 10FR DISP (MISCELLANEOUS) IMPLANT
SUPPORT WRAP ARM LG (MISCELLANEOUS) ×4 IMPLANT
SUT BONE WAX W31G (SUTURE) IMPLANT
SUT ETHILON 3 0 PS 1 (SUTURE) ×4 IMPLANT
SUT ETHILON 4 0 PS 2 18 (SUTURE) IMPLANT
SUT FIBERWIRE #2 38 T-5 BLUE (SUTURE) ×8
SUT MNCRL AB 4-0 PS2 18 (SUTURE) IMPLANT
SUT PDS AB 0 CT 36 (SUTURE) IMPLANT
SUT PROLENE 3 0 PS 2 (SUTURE) IMPLANT
SUT TIGER TAPE 7 IN WHITE (SUTURE) IMPLANT
SUT VIC AB 0 CT1 27 (SUTURE)
SUT VIC AB 0 CT1 27XBRD ANBCTR (SUTURE) IMPLANT
SUT VIC AB 2-0 SH 27 (SUTURE)
SUT VIC AB 2-0 SH 27XBRD (SUTURE) IMPLANT
SUTURE FIBERWR #2 38 T-5 BLUE (SUTURE) ×4 IMPLANT
SYR BULB 3OZ (MISCELLANEOUS) IMPLANT
TAPE FIBER 2MM 7IN #2 BLUE (SUTURE) IMPLANT
TOWEL OR 17X24 6PK STRL BLUE (TOWEL DISPOSABLE) ×4 IMPLANT
TOWEL OR NON WOVEN STRL DISP B (DISPOSABLE) ×4 IMPLANT
TUBE CONNECTING 20'X1/4 (TUBING) ×1
TUBE CONNECTING 20X1/4 (TUBING) ×3 IMPLANT
TUBING ARTHROSCOPY IRRIG 16FT (MISCELLANEOUS) ×4 IMPLANT
WATER STERILE IRR 1000ML POUR (IV SOLUTION) ×4 IMPLANT
YANKAUER SUCT BULB TIP NO VENT (SUCTIONS) IMPLANT

## 2016-03-19 NOTE — Progress Notes (Signed)
Assisted Dr. Delma Post with left, ultrasound guided, interscalene  block. Side rails up, monitors on throughout procedure. See vital signs in flow sheet. Tolerated Procedure well.

## 2016-03-19 NOTE — Anesthesia Procedure Notes (Signed)
Anesthesia Procedure Image    

## 2016-03-19 NOTE — Op Note (Signed)
Procedure(s): SHOULDER ARTHROSCOPY ROTATOR CUFF REPAIR, SUBACROMIAL DECOMPRESSION Procedure Note  Monica Franklin female 65 y.o. 03/19/2016  Procedure(s) and Anesthesia Type:  #1 LEFT SHOULDER ARTHROSCOPY ROTATOR CUFF REPAIR   #2 LEFT SHOUDLER SUBACROMIAL DECOMPRESSION -   Surgeon(s) and Role:    * Tania Ade, MD - Primary     Surgeon: Nita Sells   Assistants: Jeanmarie Hubert PA-C (Danielle was present and scrubbed throughout the procedure and was essential in positioning, assisting with the camera and instrumentation,, and closure)  Anesthesia: General endotracheal anesthesia with preoperative interscalene block given by the attending anesthesiologist    Procedure Detail  SHOULDER ARTHROSCOPY ROTATOR CUFF REPAIR, SUBACROMIAL DECOMPRESSION  Estimated Blood Loss: Min         Drains: none  Blood Given: none         Specimens: none        Complications:  * No complications entered in OR log *         Disposition: PACU - hemodynamically stable.         Condition: stable    Procedure:   INDICATIONS FOR SURGERY: The patient is 65 y.o. female who has had a long history of left shoulder pain which has been refractory to nonoperative management. She had an MRI which showed a high-grade bursal sided rotator cuff tear. Indicated for surgical treatment to try and decrease pain and restore function.  OPERATIVE FINDINGS: Examination under anesthesia: No stiffness or instability.  DESCRIPTION OF PROCEDURE: The patient was identified in preoperative  holding area where I personally marked the operative site after  verifying site, side, and procedure with the patient. An interscalene block was given by the attending anesthesiologist the holding area.  The patient was taken back to the operating room where general anesthesia was induced without complication and was placed in the beach-chair position with the back  elevated about 60 degrees and all extremities  and head and neck carefully padded and  positioned.   The left upper extremity was then prepped and  draped in a standard sterile fashion. The appropriate time-out  procedure was carried out. The patient did receive IV antibiotics  within 30 minutes of incision.   A small posterior portal incision was made and the arthroscope was introduced into the joint. An anterior portal was then established above the subscapularis using needle localization. Small cannula was placed anteriorly. Diagnostic arthroscopy was then carried out.  The subscapularis was noted to be intact. There is some fraying of the superior labrum which was debrided back to healthy labrum. Biceps tendon was intact. Glenn humeral joint surfaces were intact without chondromalacia. Mild synovitis anteriorly. The infraspinatus was intact. Supraspinatus had small full-thickness penetration with surrounding tendinopathy. This was extensively debrided from the underside.  The arthroscope was then introduced into the subacromial space a standard lateral portal was established with needle localization. The shaver was used through the lateral portal to perform extensive bursectomy. Coracoacromial ligament was examined and found to be severely frayed indicating chronic impingement.  The tear was identified from the bursal surface. The tendon edge was debrided back to healthy tendon. There was not significant retraction. The area of exposed tuberosity with about 1.5 centimeters anterior to posterior. This was debrided down to bleeding bone to promote healing. Repair was then carried out by placing 1 4.75 swivel lock peak anchor just off the articular margin centrally in the tear which was preloaded with 2 #2 FiberWire sutures. These 4 suture strands were then passed evenly throughout the  tear anterior to posterior. There were then brought over to one additional swivel lock anchor in the lateral row bringing the tendon nicely down over the prepared  tuberosity. There is no significant tension on the repair. The repair was felt to be watertight.  The coracoacromial ligament was taken down off the anterior acromion with the ArthroCare exposing a moderate hooked anterior acromial spur. A high-speed bur was then used through the lateral portal to take down the anterior acromial spur from lateral to medial in a standard acromioplasty.  The acromioplasty was also viewed from the lateral portal and the bur was used as necessary to ensure that the acromion was completely flat from posterior to anterior.  The arthroscopic equipment was removed from the joint and the portals were closed with 3-0 nylon in an interrupted fashion. Sterile dressings were then applied including Xeroform 4 x 4's ABDs and tape. The patient was then allowed to awaken from general anesthesia, placed in a sling, transferred to the stretcher and taken to the recovery room in stable condition.   POSTOPERATIVE PLAN: The patient will be discharged home today and will followup in one week for suture removal and wound check.  She will follow the standard cuff protocol.

## 2016-03-19 NOTE — Anesthesia Postprocedure Evaluation (Addendum)
Anesthesia Post Note  Patient: DYLANA GALEY  Procedure(s) Performed: Procedure(s) (LRB): SHOULDER ARTHROSCOPY ROTATOR CUFF REPAIR, SUBACROMIAL DECOMPRESSION (Left)  Patient location during evaluation: PACU Anesthesia Type: General and Regional Level of consciousness: awake and alert Pain management: pain level controlled Vital Signs Assessment: post-procedure vital signs reviewed and stable Respiratory status: spontaneous breathing, nonlabored ventilation, respiratory function stable and patient connected to nasal cannula oxygen Cardiovascular status: blood pressure returned to baseline and stable Postop Assessment: no signs of nausea or vomiting Anesthetic complications: no    Last Vitals:  Vitals:   03/19/16 1515 03/19/16 1523  BP: (!) 145/88 (!) 156/89  Pulse: 95 89  Resp: 14 14  Temp:      Last Pain:  Vitals:   03/19/16 1515  TempSrc:   PainSc: 0-No pain                 Graesyn Schreifels J

## 2016-03-19 NOTE — Anesthesia Procedure Notes (Signed)
Procedure Name: Intubation Date/Time: 03/19/2016 1:34 PM Performed by: Melynda Ripple D Pre-anesthesia Checklist: Patient identified, Emergency Drugs available, Suction available and Patient being monitored Patient Re-evaluated:Patient Re-evaluated prior to inductionOxygen Delivery Method: Circle system utilized Preoxygenation: Pre-oxygenation with 100% oxygen Intubation Type: IV induction Ventilation: Mask ventilation without difficulty Laryngoscope Size: Mac and 3 Grade View: Grade II Tube type: Oral Number of attempts: 1 Airway Equipment and Method: Stylet and Oral airway Placement Confirmation: ETT inserted through vocal cords under direct vision,  positive ETCO2 and breath sounds checked- equal and bilateral Secured at: 23 cm Tube secured with: Tape Dental Injury: Teeth and Oropharynx as per pre-operative assessment

## 2016-03-19 NOTE — Anesthesia Preprocedure Evaluation (Addendum)
Anesthesia Evaluation  Patient identified by MRN, date of birth, ID band Patient awake    Reviewed: Allergy & Precautions, NPO status , Patient's Chart, lab work & pertinent test results  Airway Mallampati: II  TM Distance: >3 FB Neck ROM: Full    Dental no notable dental hx.    Pulmonary former smoker,    Pulmonary exam normal breath sounds clear to auscultation       Cardiovascular negative cardio ROS Normal cardiovascular exam Rhythm:Regular Rate:Normal     Neuro/Psych negative psych ROS   GI/Hepatic negative GI ROS, Neg liver ROS,   Endo/Other  negative endocrine ROS  Renal/GU negative Renal ROS  negative genitourinary   Musculoskeletal  (+) Arthritis ,   Abdominal   Peds negative pediatric ROS (+)  Hematology negative hematology ROS (+)   Anesthesia Other Findings   Reproductive/Obstetrics negative OB ROS                             Anesthesia Physical Anesthesia Plan  ASA: II  Anesthesia Plan: General   Post-op Pain Management: GA combined w/ Regional for post-op pain   Induction: Intravenous  Airway Management Planned: Oral ETT  Additional Equipment:   Intra-op Plan:   Post-operative Plan: Extubation in OR  Informed Consent: I have reviewed the patients History and Physical, chart, labs and discussed the procedure including the risks, benefits and alternatives for the proposed anesthesia with the patient or authorized representative who has indicated his/her understanding and acceptance.   Dental advisory given  Plan Discussed with: CRNA  Anesthesia Plan Comments: (Discussed risks and benefits of interscalene block including failure, bleeding, infection, nerve damage, weakness. Questions answered. Patient consents to block.)       Anesthesia Quick Evaluation

## 2016-03-19 NOTE — Anesthesia Procedure Notes (Signed)
Anesthesia Regional Block:  Interscalene brachial plexus block  Pre-Anesthetic Checklist: ,, timeout performed, Correct Patient, Correct Site, Correct Laterality, Correct Procedure, Correct Position, site marked, Risks and benefits discussed,  Surgical consent,  Pre-op evaluation,  At surgeon's request and post-op pain management  Laterality: Left and Upper  Prep: chloraprep       Needles:  Injection technique: Single-shot  Needle Type: Echogenic Stimulator Needle     Needle Length: 9cm 9 cm Needle Gauge: 21 and 21 G    Additional Needles:  Procedures: ultrasound guided (picture in chart) and nerve stimulator  Motor weakness within 20 minutes. Interscalene brachial plexus block  Nerve Stimulator or Paresthesia:  Response: deltoid, 0.6 mA,   Additional Responses:   Narrative:  Injection made incrementally with aspirations every 5 mL.  Performed by: Personally  Anesthesiologist: Franne Grip  Additional Notes: No pain on injection. No increased pressure with injection. Tolerated well.

## 2016-03-19 NOTE — Transfer of Care (Signed)
Immediate Anesthesia Transfer of Care Note  Patient: Monica Franklin  Procedure(s) Performed: Procedure(s) with comments: SHOULDER ARTHROSCOPY ROTATOR CUFF REPAIR, SUBACROMIAL DECOMPRESSION (Left) - SHOULDER ARTHROSCOPY ROTATOR CUFF REPAIR,  SUBACROMIAL DECOMPRESSION  Patient Location: PACU  Anesthesia Type:GA combined with regional for post-op pain  Level of Consciousness: awake and alert   Airway & Oxygen Therapy: Patient Spontanous Breathing and Patient connected to face mask oxygen  Post-op Assessment: Report given to RN and Post -op Vital signs reviewed and stable  Post vital signs: Reviewed and stable  Last Vitals:  Vitals:   03/19/16 1215 03/19/16 1230  BP: (!) 115/59 125/73  Pulse: 78 80  Resp: 14 20  Temp:      Last Pain:  Vitals:   03/19/16 1201  TempSrc: Oral  PainSc: 3          Complications: No apparent anesthesia complications

## 2016-03-19 NOTE — H&P (Signed)
Monica Franklin is an 65 y.o. female.   Chief Complaint: Left shoulder pain and dysfunction HPI: Left shoulder pain with history of high-grade partial rotator cuff tear, failed physical therapy injections, activity modification and NSAIDs.  Past Medical History:  Diagnosis Date  . Arthritis    wrists  . Chest pain   . Hyperlipidemia   . SOB (shortness of breath)     Past Surgical History:  Procedure Laterality Date  . ANAL FISSURE REPAIR    . BREAST ENHANCEMENT SURGERY    . NASAL RECONSTRUCTION    . TONSILLECTOMY    . TUBAL LIGATION      Family History  Problem Relation Age of Onset  . Heart attack Father   . Colon cancer Mother   . Colon cancer Sister     LUNG AND BRAIN  . COPD Brother    Social History:  reports that she quit smoking about 2 years ago. She has never used smokeless tobacco. She reports that she drinks alcohol. She reports that she does not use drugs.  Allergies:  Allergies  Allergen Reactions  . Sulfa Antibiotics Hives    In her 61s    Medications Prior to Admission  Medication Sig Dispense Refill  . Azelaic Acid (FINACEA EX) Apply 1 application topically daily. For Rosecea    . doxycycline (VIBRAMYCIN) 50 MG capsule Take 50 mg by mouth daily as needed.     . Ivermectin (SOOLANTRA EX) Apply 1 application topically daily. For Rosacea    . Magnesium 500 MG CAPS Take 1 capsule by mouth daily. Magnesium L Threonate    . Potassium (POTASSIMIN PO) Take 500 mg by mouth daily.     Marland Kitchen UNABLE TO FIND Take 1 tablet by mouth every evening. Kavinace    . UNABLE TO FIND Med Name: hemp oil    . ASTAXANTHIN PO Take 325 mg by mouth daily.     . Omega-3 Krill Oil 500 MG CAPS Take by mouth.    . rosuvastatin (CRESTOR) 10 MG tablet Take 1 tablet (10 mg total) by mouth daily. 30 tablet 11  . Ubiquinol 100 MG CAPS Take 2 tablets by mouth daily.      No results found for this or any previous visit (from the past 48 hour(s)). No results found.  Review of Systems  All  other systems reviewed and are negative.   Blood pressure 125/73, pulse 80, temperature 98.8 F (37.1 C), temperature source Oral, resp. rate 20, height 5\' 7"  (1.702 m), weight 75.8 kg (167 lb), SpO2 100 %. Physical Exam  Constitutional: She is oriented to person, place, and time. She appears well-developed and well-nourished.  HENT:  Head: Atraumatic.  Eyes: EOM are normal.  Cardiovascular: Intact distal pulses.   Musculoskeletal:  Left shoulder pain weakness rotator cuff testing, distally neurovascularly intact.  Neurological: She is alert and oriented to person, place, and time.  Skin: Skin is warm and dry.  Psychiatric: She has a normal mood and affect.     Assessment/Plan Left shoulder pain with history of high-grade partial rotator cuff tear, failed physical therapy injections, activity modification and NSAIDs. Plan left shoulder arthroscopic rotator cuff repair versus less likely debridement, subacromial decompression Risks / benefits of surgery discussed Consent on chart  NPO for OR Preop antibiotics   Nita Sells, MD 03/19/2016, 1:05 PM

## 2016-03-19 NOTE — Discharge Instructions (Signed)
Discharge Instructions after Arthroscopic Shoulder Repair   A sling has been provided for you. Remain in your sling at all times. This includes sleeping in your sling.  Use ice on the shoulder intermittently over the first 48 hours after surgery.  Pain medicine has been prescribed for you.  Use your medicine liberally over the first 48 hours, and then you can begin to taper your use. You may take Extra Strength Tylenol or Tylenol only in place of the pain pills. DO NOT take ANY nonsteroidal anti-inflammatory pain medications: Advil, Motrin, Ibuprofen, Aleve, Naproxen, or Narprosyn.  You may remove your dressing after two days. If the incision sites are still moist, place a Band-Aid over the moist site(s). Change Band-Aids daily until dry.  You may shower 5 days after surgery. The incisions CANNOT get wet prior to 5 days. Simply allow the water to wash over the site and then pat dry. Do not rub the incisions. Make sure your axilla (armpit) is completely dry after showering.  Take one aspirin a day for 2 weeks after surgery, unless you have an aspirin sensitivity/ allergy or asthma.   Please call [phone] during normal business hours or [phone] after hours for any problems. Including the following:  - excessive redness of the incisions - drainage for more than 4 days - fever of more than 101.5 F  *Please note that pain medications will not be refilled after hours or on weekends.    Regional Anesthesia Blocks  1. Numbness or the inability to move the "blocked" extremity may last from 3-48 hours after placement. The length of time depends on the medication injected and your individual response to the medication. If the numbness is not going away after 48 hours, call your surgeon.  2. The extremity that is blocked will need to be protected until the numbness is gone and the  Strength has returned. Because you cannot feel it, you will need to take extra care to avoid injury. Because it  may be weak, you may have difficulty moving it or using it. You may not know what position it is in without looking at it while the block is in effect.  3. For blocks in the legs and feet, returning to weight bearing and walking needs to be done carefully. You will need to wait until the numbness is entirely gone and the strength has returned. You should be able to move your leg and foot normally before you try and bear weight or walk. You will need someone to be with you when you first try to ensure you do not fall and possibly risk injury.  4. Bruising and tenderness at the needle site are common side effects and will resolve in a few days.     Post Anesthesia Home Care Instructions  Activity: Get plenty of rest for the remainder of the day. A responsible adult should stay with you for 24 hours following the procedure.  For the next 24 hours, DO NOT: -Drive a car -Paediatric nurse -Drink alcoholic beverages -Take any medication unless instructed by your physician -Make any legal decisions or sign important papers.  Meals: Start with liquid foods such as gelatin or soup. Progress to regular foods as tolerated. Avoid greasy, spicy, heavy foods. If nausea and/or vomiting occur, drink only clear liquids until the nausea and/or vomiting subsides. Call your physician if vomiting continues.  Special Instructions/Symptoms: Your throat may feel dry or sore from the anesthesia or the breathing tube placed in your throat during  surgery. If this causes discomfort, gargle with warm salt water. The discomfort should disappear within 24 hours.  If you had a scopolamine patch placed behind your ear for the management of post- operative nausea and/or vomiting:  1. The medication in the patch is effective for 72 hours, after which it should be removed.  Wrap patch in a tissue and discard in the trash. Wash hands thoroughly with soap and water. 2. You may remove the patch earlier than 72 hours if you  experience unpleasant side effects which may include dry mouth, dizziness or visual disturbances. 3. Avoid touching the patch. Wash your hands with soap and water after contact with the patch.    5. Persistent numbness or new problems with movement should be communicated to the surgeon or the [facility] [phone] [place] [phone]).

## 2016-03-20 ENCOUNTER — Encounter (HOSPITAL_BASED_OUTPATIENT_CLINIC_OR_DEPARTMENT_OTHER): Payer: Self-pay | Admitting: Orthopedic Surgery

## 2016-03-28 ENCOUNTER — Encounter (HOSPITAL_BASED_OUTPATIENT_CLINIC_OR_DEPARTMENT_OTHER): Payer: Self-pay | Admitting: Orthopedic Surgery

## 2016-03-28 DIAGNOSIS — Z9889 Other specified postprocedural states: Secondary | ICD-10-CM | POA: Diagnosis not present

## 2016-04-02 DIAGNOSIS — M25612 Stiffness of left shoulder, not elsewhere classified: Secondary | ICD-10-CM | POA: Diagnosis not present

## 2016-04-02 DIAGNOSIS — M25512 Pain in left shoulder: Secondary | ICD-10-CM | POA: Diagnosis not present

## 2016-04-03 ENCOUNTER — Other Ambulatory Visit: Payer: Medicare Other

## 2016-04-05 DIAGNOSIS — M25512 Pain in left shoulder: Secondary | ICD-10-CM | POA: Diagnosis not present

## 2016-04-05 DIAGNOSIS — M25612 Stiffness of left shoulder, not elsewhere classified: Secondary | ICD-10-CM | POA: Diagnosis not present

## 2016-04-09 DIAGNOSIS — M25612 Stiffness of left shoulder, not elsewhere classified: Secondary | ICD-10-CM | POA: Diagnosis not present

## 2016-04-09 DIAGNOSIS — M25512 Pain in left shoulder: Secondary | ICD-10-CM | POA: Diagnosis not present

## 2016-04-12 DIAGNOSIS — M25612 Stiffness of left shoulder, not elsewhere classified: Secondary | ICD-10-CM | POA: Diagnosis not present

## 2016-04-12 DIAGNOSIS — M25512 Pain in left shoulder: Secondary | ICD-10-CM | POA: Diagnosis not present

## 2016-04-23 DIAGNOSIS — M25512 Pain in left shoulder: Secondary | ICD-10-CM | POA: Diagnosis not present

## 2016-04-23 DIAGNOSIS — M25612 Stiffness of left shoulder, not elsewhere classified: Secondary | ICD-10-CM | POA: Diagnosis not present

## 2016-04-26 DIAGNOSIS — M25512 Pain in left shoulder: Secondary | ICD-10-CM | POA: Diagnosis not present

## 2016-04-26 DIAGNOSIS — M25612 Stiffness of left shoulder, not elsewhere classified: Secondary | ICD-10-CM | POA: Diagnosis not present

## 2016-04-30 DIAGNOSIS — M25512 Pain in left shoulder: Secondary | ICD-10-CM | POA: Diagnosis not present

## 2016-04-30 DIAGNOSIS — M25612 Stiffness of left shoulder, not elsewhere classified: Secondary | ICD-10-CM | POA: Diagnosis not present

## 2016-05-02 DIAGNOSIS — Z9889 Other specified postprocedural states: Secondary | ICD-10-CM | POA: Diagnosis not present

## 2016-05-07 DIAGNOSIS — M25512 Pain in left shoulder: Secondary | ICD-10-CM | POA: Diagnosis not present

## 2016-05-07 DIAGNOSIS — M25612 Stiffness of left shoulder, not elsewhere classified: Secondary | ICD-10-CM | POA: Diagnosis not present

## 2016-05-10 DIAGNOSIS — M25612 Stiffness of left shoulder, not elsewhere classified: Secondary | ICD-10-CM | POA: Diagnosis not present

## 2016-05-10 DIAGNOSIS — M25512 Pain in left shoulder: Secondary | ICD-10-CM | POA: Diagnosis not present

## 2016-05-22 DIAGNOSIS — M25612 Stiffness of left shoulder, not elsewhere classified: Secondary | ICD-10-CM | POA: Diagnosis not present

## 2016-05-22 DIAGNOSIS — M25512 Pain in left shoulder: Secondary | ICD-10-CM | POA: Diagnosis not present

## 2016-05-24 DIAGNOSIS — M25512 Pain in left shoulder: Secondary | ICD-10-CM | POA: Diagnosis not present

## 2016-05-24 DIAGNOSIS — M25612 Stiffness of left shoulder, not elsewhere classified: Secondary | ICD-10-CM | POA: Diagnosis not present

## 2016-05-30 DIAGNOSIS — M25512 Pain in left shoulder: Secondary | ICD-10-CM | POA: Diagnosis not present

## 2016-05-30 DIAGNOSIS — M25612 Stiffness of left shoulder, not elsewhere classified: Secondary | ICD-10-CM | POA: Diagnosis not present

## 2016-06-04 DIAGNOSIS — M25512 Pain in left shoulder: Secondary | ICD-10-CM | POA: Diagnosis not present

## 2016-06-04 DIAGNOSIS — M25612 Stiffness of left shoulder, not elsewhere classified: Secondary | ICD-10-CM | POA: Diagnosis not present

## 2016-06-06 DIAGNOSIS — S233XXA Sprain of ligaments of thoracic spine, initial encounter: Secondary | ICD-10-CM | POA: Diagnosis not present

## 2016-06-06 DIAGNOSIS — M9901 Segmental and somatic dysfunction of cervical region: Secondary | ICD-10-CM | POA: Diagnosis not present

## 2016-06-07 DIAGNOSIS — L820 Inflamed seborrheic keratosis: Secondary | ICD-10-CM | POA: Diagnosis not present

## 2016-06-07 DIAGNOSIS — D485 Neoplasm of uncertain behavior of skin: Secondary | ICD-10-CM | POA: Diagnosis not present

## 2016-06-07 DIAGNOSIS — M25512 Pain in left shoulder: Secondary | ICD-10-CM | POA: Diagnosis not present

## 2016-06-07 DIAGNOSIS — M25612 Stiffness of left shoulder, not elsewhere classified: Secondary | ICD-10-CM | POA: Diagnosis not present

## 2016-06-07 DIAGNOSIS — L578 Other skin changes due to chronic exposure to nonionizing radiation: Secondary | ICD-10-CM | POA: Diagnosis not present

## 2016-06-07 DIAGNOSIS — D1800 Hemangioma unspecified site: Secondary | ICD-10-CM | POA: Diagnosis not present

## 2016-06-07 DIAGNOSIS — L718 Other rosacea: Secondary | ICD-10-CM | POA: Diagnosis not present

## 2016-06-07 DIAGNOSIS — D229 Melanocytic nevi, unspecified: Secondary | ICD-10-CM | POA: Diagnosis not present

## 2016-06-07 DIAGNOSIS — L821 Other seborrheic keratosis: Secondary | ICD-10-CM | POA: Diagnosis not present

## 2016-06-07 DIAGNOSIS — Z85828 Personal history of other malignant neoplasm of skin: Secondary | ICD-10-CM | POA: Diagnosis not present

## 2016-06-07 DIAGNOSIS — L812 Freckles: Secondary | ICD-10-CM | POA: Diagnosis not present

## 2016-06-07 DIAGNOSIS — Z1283 Encounter for screening for malignant neoplasm of skin: Secondary | ICD-10-CM | POA: Diagnosis not present

## 2016-06-07 DIAGNOSIS — I8393 Asymptomatic varicose veins of bilateral lower extremities: Secondary | ICD-10-CM | POA: Diagnosis not present

## 2016-06-11 DIAGNOSIS — M25612 Stiffness of left shoulder, not elsewhere classified: Secondary | ICD-10-CM | POA: Diagnosis not present

## 2016-06-11 DIAGNOSIS — M25512 Pain in left shoulder: Secondary | ICD-10-CM | POA: Diagnosis not present

## 2016-06-12 DIAGNOSIS — Z0100 Encounter for examination of eyes and vision without abnormal findings: Secondary | ICD-10-CM | POA: Diagnosis not present

## 2016-06-12 DIAGNOSIS — H2513 Age-related nuclear cataract, bilateral: Secondary | ICD-10-CM | POA: Diagnosis not present

## 2016-06-20 DIAGNOSIS — Z9889 Other specified postprocedural states: Secondary | ICD-10-CM | POA: Diagnosis not present

## 2016-07-04 DIAGNOSIS — S233XXA Sprain of ligaments of thoracic spine, initial encounter: Secondary | ICD-10-CM | POA: Diagnosis not present

## 2016-07-04 DIAGNOSIS — M9901 Segmental and somatic dysfunction of cervical region: Secondary | ICD-10-CM | POA: Diagnosis not present

## 2016-07-24 ENCOUNTER — Other Ambulatory Visit: Payer: Medicare Other

## 2016-07-31 ENCOUNTER — Other Ambulatory Visit (INDEPENDENT_AMBULATORY_CARE_PROVIDER_SITE_OTHER): Payer: Medicare Other

## 2016-07-31 DIAGNOSIS — E784 Other hyperlipidemia: Secondary | ICD-10-CM

## 2016-07-31 DIAGNOSIS — E7849 Other hyperlipidemia: Secondary | ICD-10-CM

## 2016-07-31 LAB — HEPATIC FUNCTION PANEL
ALT: 35 IU/L — AB (ref 0–32)
AST: 30 IU/L (ref 0–40)
Albumin: 4.6 g/dL (ref 3.6–4.8)
Alkaline Phosphatase: 67 IU/L (ref 39–117)
Bilirubin Total: 0.3 mg/dL (ref 0.0–1.2)
Bilirubin, Direct: 0.08 mg/dL (ref 0.00–0.40)
Total Protein: 7.3 g/dL (ref 6.0–8.5)

## 2016-07-31 LAB — LIPID PANEL
CHOL/HDL RATIO: 6.5 ratio — AB (ref 0.0–4.4)
CHOLESTEROL TOTAL: 275 mg/dL — AB (ref 100–199)
HDL: 42 mg/dL (ref >39)
LDL CALC: 194 mg/dL — AB (ref 0–99)
TRIGLYCERIDES: 193 mg/dL — AB (ref 0–149)
VLDL Cholesterol Cal: 39 mg/dL (ref 5–40)

## 2016-08-01 ENCOUNTER — Telehealth: Payer: Self-pay | Admitting: Pharmacist

## 2016-08-01 DIAGNOSIS — M9901 Segmental and somatic dysfunction of cervical region: Secondary | ICD-10-CM | POA: Diagnosis not present

## 2016-08-01 DIAGNOSIS — E785 Hyperlipidemia, unspecified: Secondary | ICD-10-CM

## 2016-08-01 DIAGNOSIS — S233XXA Sprain of ligaments of thoracic spine, initial encounter: Secondary | ICD-10-CM | POA: Diagnosis not present

## 2016-08-01 MED ORDER — PRAVASTATIN SODIUM 20 MG PO TABS: 20.0000 mg | ORAL_TABLET | Freq: Every evening | ORAL | 11 refills | Status: AC

## 2016-08-01 NOTE — Telephone Encounter (Signed)
Discussed lipid panel results with pt. She stopped taking her Crestor 10mg  daily 3 months ago because she developed muscle aches in her legs. She tried taking the Crestor every other day but still experienced myalgias. She was taking CoQ10 as well. Pt has been very hesitant to take statin medicine because she has read about the problems statins can cause. Discussed that her elevated risk of developing heart disease given her elevated baseline cholesterol should outweigh concerns she has before trying a different statin. She was hesitant but did agree to try pravastatin 20mg  daily. Rx sent in and will recheck lipids in 3 months.

## 2016-08-03 DIAGNOSIS — S233XXA Sprain of ligaments of thoracic spine, initial encounter: Secondary | ICD-10-CM | POA: Diagnosis not present

## 2016-08-03 DIAGNOSIS — M9901 Segmental and somatic dysfunction of cervical region: Secondary | ICD-10-CM | POA: Diagnosis not present

## 2016-08-08 DIAGNOSIS — M9901 Segmental and somatic dysfunction of cervical region: Secondary | ICD-10-CM | POA: Diagnosis not present

## 2016-08-08 DIAGNOSIS — S233XXA Sprain of ligaments of thoracic spine, initial encounter: Secondary | ICD-10-CM | POA: Diagnosis not present

## 2016-08-10 DIAGNOSIS — M9901 Segmental and somatic dysfunction of cervical region: Secondary | ICD-10-CM | POA: Diagnosis not present

## 2016-08-10 DIAGNOSIS — S233XXA Sprain of ligaments of thoracic spine, initial encounter: Secondary | ICD-10-CM | POA: Diagnosis not present

## 2016-09-05 DIAGNOSIS — S233XXA Sprain of ligaments of thoracic spine, initial encounter: Secondary | ICD-10-CM | POA: Diagnosis not present

## 2016-09-05 DIAGNOSIS — M9901 Segmental and somatic dysfunction of cervical region: Secondary | ICD-10-CM | POA: Diagnosis not present

## 2016-09-07 DIAGNOSIS — S233XXA Sprain of ligaments of thoracic spine, initial encounter: Secondary | ICD-10-CM | POA: Diagnosis not present

## 2016-09-07 DIAGNOSIS — M9901 Segmental and somatic dysfunction of cervical region: Secondary | ICD-10-CM | POA: Diagnosis not present

## 2016-09-12 DIAGNOSIS — S233XXA Sprain of ligaments of thoracic spine, initial encounter: Secondary | ICD-10-CM | POA: Diagnosis not present

## 2016-09-12 DIAGNOSIS — M9901 Segmental and somatic dysfunction of cervical region: Secondary | ICD-10-CM | POA: Diagnosis not present

## 2016-09-26 DIAGNOSIS — S233XXA Sprain of ligaments of thoracic spine, initial encounter: Secondary | ICD-10-CM | POA: Diagnosis not present

## 2016-09-26 DIAGNOSIS — M9901 Segmental and somatic dysfunction of cervical region: Secondary | ICD-10-CM | POA: Diagnosis not present

## 2016-10-30 ENCOUNTER — Other Ambulatory Visit: Payer: Medicare Other

## 2016-10-31 DIAGNOSIS — S233XXA Sprain of ligaments of thoracic spine, initial encounter: Secondary | ICD-10-CM | POA: Diagnosis not present

## 2016-10-31 DIAGNOSIS — M9901 Segmental and somatic dysfunction of cervical region: Secondary | ICD-10-CM | POA: Diagnosis not present

## 2016-11-06 ENCOUNTER — Other Ambulatory Visit: Payer: Self-pay | Admitting: Family Medicine

## 2016-11-06 ENCOUNTER — Ambulatory Visit
Admission: RE | Admit: 2016-11-06 | Discharge: 2016-11-06 | Disposition: A | Discharge status: Home / Self Care | Payer: Medicare Other | Source: Ambulatory Visit | Attending: Family Medicine | Admitting: Family Medicine

## 2016-11-06 DIAGNOSIS — M25562 Pain in left knee: Secondary | ICD-10-CM

## 2016-11-06 DIAGNOSIS — M1712 Unilateral primary osteoarthritis, left knee: Secondary | ICD-10-CM | POA: Diagnosis not present

## 2016-12-05 DIAGNOSIS — S233XXA Sprain of ligaments of thoracic spine, initial encounter: Secondary | ICD-10-CM | POA: Diagnosis not present

## 2016-12-05 DIAGNOSIS — M9901 Segmental and somatic dysfunction of cervical region: Secondary | ICD-10-CM | POA: Diagnosis not present

## 2017-01-09 DIAGNOSIS — S233XXA Sprain of ligaments of thoracic spine, initial encounter: Secondary | ICD-10-CM | POA: Diagnosis not present

## 2017-01-09 DIAGNOSIS — M9901 Segmental and somatic dysfunction of cervical region: Secondary | ICD-10-CM | POA: Diagnosis not present

## 2017-02-20 DIAGNOSIS — M9901 Segmental and somatic dysfunction of cervical region: Secondary | ICD-10-CM | POA: Diagnosis not present

## 2017-02-20 DIAGNOSIS — S233XXA Sprain of ligaments of thoracic spine, initial encounter: Secondary | ICD-10-CM | POA: Diagnosis not present

## 2017-03-20 DIAGNOSIS — S233XXA Sprain of ligaments of thoracic spine, initial encounter: Secondary | ICD-10-CM | POA: Diagnosis not present

## 2017-03-20 DIAGNOSIS — M9901 Segmental and somatic dysfunction of cervical region: Secondary | ICD-10-CM | POA: Diagnosis not present

## 2017-04-08 ENCOUNTER — Ambulatory Visit
Admission: RE | Admit: 2017-04-08 | Discharge: 2017-04-08 | Disposition: A | Discharge status: Home / Self Care | Payer: Medicare Other | Source: Ambulatory Visit | Attending: Family Medicine | Admitting: Family Medicine

## 2017-04-08 ENCOUNTER — Other Ambulatory Visit: Payer: Self-pay | Admitting: Family Medicine

## 2017-04-08 DIAGNOSIS — R1084 Generalized abdominal pain: Secondary | ICD-10-CM

## 2017-04-08 DIAGNOSIS — K625 Hemorrhage of anus and rectum: Secondary | ICD-10-CM | POA: Diagnosis not present

## 2017-04-08 DIAGNOSIS — K5732 Diverticulitis of large intestine without perforation or abscess without bleeding: Secondary | ICD-10-CM | POA: Diagnosis not present

## 2017-04-08 DIAGNOSIS — R109 Unspecified abdominal pain: Secondary | ICD-10-CM | POA: Diagnosis not present

## 2017-04-08 DIAGNOSIS — K5792 Diverticulitis of intestine, part unspecified, without perforation or abscess without bleeding: Secondary | ICD-10-CM | POA: Diagnosis not present

## 2017-04-08 MED ORDER — IOPAMIDOL (ISOVUE-300) INJECTION 61%
100.0000 mL | Freq: Once | INTRAVENOUS | Status: AC | PRN
  Administered 2017-04-08: 100 mL via INTRAVENOUS

## 2017-04-09 ENCOUNTER — Other Ambulatory Visit: Payer: Self-pay | Admitting: Family Medicine

## 2017-04-09 DIAGNOSIS — R1084 Generalized abdominal pain: Secondary | ICD-10-CM

## 2017-04-24 DIAGNOSIS — M9901 Segmental and somatic dysfunction of cervical region: Secondary | ICD-10-CM | POA: Diagnosis not present

## 2017-04-24 DIAGNOSIS — S233XXA Sprain of ligaments of thoracic spine, initial encounter: Secondary | ICD-10-CM | POA: Diagnosis not present

## 2017-05-09 DIAGNOSIS — R030 Elevated blood-pressure reading, without diagnosis of hypertension: Secondary | ICD-10-CM | POA: Diagnosis not present

## 2017-05-09 DIAGNOSIS — R1084 Generalized abdominal pain: Secondary | ICD-10-CM | POA: Diagnosis not present

## 2017-05-09 DIAGNOSIS — Z0000 Encounter for general adult medical examination without abnormal findings: Secondary | ICD-10-CM | POA: Diagnosis not present

## 2017-05-09 DIAGNOSIS — E7800 Pure hypercholesterolemia, unspecified: Secondary | ICD-10-CM | POA: Diagnosis not present

## 2017-05-09 DIAGNOSIS — Z1159 Encounter for screening for other viral diseases: Secondary | ICD-10-CM | POA: Diagnosis not present

## 2017-05-09 DIAGNOSIS — Z1211 Encounter for screening for malignant neoplasm of colon: Secondary | ICD-10-CM | POA: Diagnosis not present

## 2017-05-09 DIAGNOSIS — Z1389 Encounter for screening for other disorder: Secondary | ICD-10-CM | POA: Diagnosis not present

## 2017-05-09 DIAGNOSIS — K219 Gastro-esophageal reflux disease without esophagitis: Secondary | ICD-10-CM | POA: Diagnosis not present

## 2017-05-09 DIAGNOSIS — N3941 Urge incontinence: Secondary | ICD-10-CM | POA: Diagnosis not present

## 2017-05-15 DIAGNOSIS — S233XXA Sprain of ligaments of thoracic spine, initial encounter: Secondary | ICD-10-CM | POA: Diagnosis not present

## 2017-05-15 DIAGNOSIS — M9901 Segmental and somatic dysfunction of cervical region: Secondary | ICD-10-CM | POA: Diagnosis not present

## 2017-06-13 DIAGNOSIS — L578 Other skin changes due to chronic exposure to nonionizing radiation: Secondary | ICD-10-CM | POA: Diagnosis not present

## 2017-06-13 DIAGNOSIS — L719 Rosacea, unspecified: Secondary | ICD-10-CM | POA: Diagnosis not present

## 2017-06-13 DIAGNOSIS — Z1283 Encounter for screening for malignant neoplasm of skin: Secondary | ICD-10-CM | POA: Diagnosis not present

## 2017-06-13 DIAGNOSIS — D1800 Hemangioma unspecified site: Secondary | ICD-10-CM | POA: Diagnosis not present

## 2017-06-13 DIAGNOSIS — D229 Melanocytic nevi, unspecified: Secondary | ICD-10-CM | POA: Diagnosis not present

## 2017-06-13 DIAGNOSIS — L821 Other seborrheic keratosis: Secondary | ICD-10-CM | POA: Diagnosis not present

## 2017-06-13 DIAGNOSIS — L812 Freckles: Secondary | ICD-10-CM | POA: Diagnosis not present

## 2017-06-13 DIAGNOSIS — L820 Inflamed seborrheic keratosis: Secondary | ICD-10-CM | POA: Diagnosis not present

## 2017-06-13 DIAGNOSIS — D485 Neoplasm of uncertain behavior of skin: Secondary | ICD-10-CM | POA: Diagnosis not present

## 2017-06-17 DIAGNOSIS — H5203 Hypermetropia, bilateral: Secondary | ICD-10-CM | POA: Diagnosis not present

## 2017-06-17 DIAGNOSIS — H1859 Other hereditary corneal dystrophies: Secondary | ICD-10-CM | POA: Diagnosis not present

## 2017-06-19 DIAGNOSIS — M9901 Segmental and somatic dysfunction of cervical region: Secondary | ICD-10-CM | POA: Diagnosis not present

## 2017-06-19 DIAGNOSIS — S233XXA Sprain of ligaments of thoracic spine, initial encounter: Secondary | ICD-10-CM | POA: Diagnosis not present

## 2017-07-30 DIAGNOSIS — Z800 Family history of malignant neoplasm of digestive organs: Secondary | ICD-10-CM | POA: Diagnosis not present

## 2017-07-30 DIAGNOSIS — K5901 Slow transit constipation: Secondary | ICD-10-CM | POA: Diagnosis not present

## 2017-07-30 DIAGNOSIS — K5792 Diverticulitis of intestine, part unspecified, without perforation or abscess without bleeding: Secondary | ICD-10-CM | POA: Diagnosis not present

## 2017-07-31 DIAGNOSIS — S233XXA Sprain of ligaments of thoracic spine, initial encounter: Secondary | ICD-10-CM | POA: Diagnosis not present

## 2017-07-31 DIAGNOSIS — M9901 Segmental and somatic dysfunction of cervical region: Secondary | ICD-10-CM | POA: Diagnosis not present

## 2017-08-09 DIAGNOSIS — D123 Benign neoplasm of transverse colon: Secondary | ICD-10-CM | POA: Diagnosis not present

## 2017-08-09 DIAGNOSIS — R933 Abnormal findings on diagnostic imaging of other parts of digestive tract: Secondary | ICD-10-CM | POA: Diagnosis not present

## 2017-08-09 DIAGNOSIS — Z800 Family history of malignant neoplasm of digestive organs: Secondary | ICD-10-CM | POA: Diagnosis not present

## 2017-08-09 DIAGNOSIS — K5730 Diverticulosis of large intestine without perforation or abscess without bleeding: Secondary | ICD-10-CM | POA: Diagnosis not present

## 2017-08-13 DIAGNOSIS — D123 Benign neoplasm of transverse colon: Secondary | ICD-10-CM | POA: Diagnosis not present

## 2017-09-04 DIAGNOSIS — S233XXA Sprain of ligaments of thoracic spine, initial encounter: Secondary | ICD-10-CM | POA: Diagnosis not present

## 2017-09-04 DIAGNOSIS — M9901 Segmental and somatic dysfunction of cervical region: Secondary | ICD-10-CM | POA: Diagnosis not present

## 2017-10-09 DIAGNOSIS — S233XXA Sprain of ligaments of thoracic spine, initial encounter: Secondary | ICD-10-CM | POA: Diagnosis not present

## 2017-10-09 DIAGNOSIS — M9901 Segmental and somatic dysfunction of cervical region: Secondary | ICD-10-CM | POA: Diagnosis not present

## 2017-11-20 DIAGNOSIS — M9901 Segmental and somatic dysfunction of cervical region: Secondary | ICD-10-CM | POA: Diagnosis not present

## 2017-11-20 DIAGNOSIS — S233XXA Sprain of ligaments of thoracic spine, initial encounter: Secondary | ICD-10-CM | POA: Diagnosis not present

## 2017-12-25 DIAGNOSIS — M9901 Segmental and somatic dysfunction of cervical region: Secondary | ICD-10-CM | POA: Diagnosis not present

## 2017-12-25 DIAGNOSIS — S233XXA Sprain of ligaments of thoracic spine, initial encounter: Secondary | ICD-10-CM | POA: Diagnosis not present

## 2018-01-22 DIAGNOSIS — M9901 Segmental and somatic dysfunction of cervical region: Secondary | ICD-10-CM | POA: Diagnosis not present

## 2018-01-22 DIAGNOSIS — S233XXA Sprain of ligaments of thoracic spine, initial encounter: Secondary | ICD-10-CM | POA: Diagnosis not present

## 2018-02-28 DIAGNOSIS — S338XXA Sprain of other parts of lumbar spine and pelvis, initial encounter: Secondary | ICD-10-CM | POA: Diagnosis not present

## 2018-02-28 DIAGNOSIS — M9903 Segmental and somatic dysfunction of lumbar region: Secondary | ICD-10-CM | POA: Diagnosis not present

## 2018-04-09 DIAGNOSIS — M9903 Segmental and somatic dysfunction of lumbar region: Secondary | ICD-10-CM | POA: Diagnosis not present

## 2018-04-09 DIAGNOSIS — S338XXA Sprain of other parts of lumbar spine and pelvis, initial encounter: Secondary | ICD-10-CM | POA: Diagnosis not present

## 2018-05-12 DIAGNOSIS — E7800 Pure hypercholesterolemia, unspecified: Secondary | ICD-10-CM | POA: Diagnosis not present

## 2018-05-12 DIAGNOSIS — Z1389 Encounter for screening for other disorder: Secondary | ICD-10-CM | POA: Diagnosis not present

## 2018-05-12 DIAGNOSIS — Z0000 Encounter for general adult medical examination without abnormal findings: Secondary | ICD-10-CM | POA: Diagnosis not present

## 2018-05-12 DIAGNOSIS — E2839 Other primary ovarian failure: Secondary | ICD-10-CM | POA: Diagnosis not present

## 2018-05-12 DIAGNOSIS — Z1211 Encounter for screening for malignant neoplasm of colon: Secondary | ICD-10-CM | POA: Diagnosis not present

## 2018-05-12 DIAGNOSIS — G4700 Insomnia, unspecified: Secondary | ICD-10-CM | POA: Diagnosis not present

## 2018-05-12 DIAGNOSIS — R768 Other specified abnormal immunological findings in serum: Secondary | ICD-10-CM | POA: Diagnosis not present

## 2018-05-12 DIAGNOSIS — K219 Gastro-esophageal reflux disease without esophagitis: Secondary | ICD-10-CM | POA: Diagnosis not present

## 2018-05-12 DIAGNOSIS — M25551 Pain in right hip: Secondary | ICD-10-CM | POA: Diagnosis not present

## 2018-05-12 DIAGNOSIS — N3941 Urge incontinence: Secondary | ICD-10-CM | POA: Diagnosis not present

## 2018-06-12 DIAGNOSIS — S338XXA Sprain of other parts of lumbar spine and pelvis, initial encounter: Secondary | ICD-10-CM | POA: Diagnosis not present

## 2018-06-12 DIAGNOSIS — M9903 Segmental and somatic dysfunction of lumbar region: Secondary | ICD-10-CM | POA: Diagnosis not present

## 2018-06-19 DIAGNOSIS — D225 Melanocytic nevi of trunk: Secondary | ICD-10-CM | POA: Diagnosis not present

## 2018-06-19 DIAGNOSIS — Z1283 Encounter for screening for malignant neoplasm of skin: Secondary | ICD-10-CM | POA: Diagnosis not present

## 2018-06-19 DIAGNOSIS — L719 Rosacea, unspecified: Secondary | ICD-10-CM | POA: Diagnosis not present

## 2018-06-19 DIAGNOSIS — L820 Inflamed seborrheic keratosis: Secondary | ICD-10-CM | POA: Diagnosis not present

## 2018-06-19 DIAGNOSIS — L918 Other hypertrophic disorders of the skin: Secondary | ICD-10-CM | POA: Diagnosis not present

## 2018-06-19 DIAGNOSIS — Z85828 Personal history of other malignant neoplasm of skin: Secondary | ICD-10-CM | POA: Diagnosis not present

## 2018-06-19 DIAGNOSIS — L821 Other seborrheic keratosis: Secondary | ICD-10-CM | POA: Diagnosis not present

## 2018-06-19 DIAGNOSIS — D229 Melanocytic nevi, unspecified: Secondary | ICD-10-CM | POA: Diagnosis not present

## 2018-06-19 DIAGNOSIS — L812 Freckles: Secondary | ICD-10-CM | POA: Diagnosis not present

## 2018-06-19 DIAGNOSIS — L578 Other skin changes due to chronic exposure to nonionizing radiation: Secondary | ICD-10-CM | POA: Diagnosis not present

## 2018-06-19 DIAGNOSIS — D2230 Melanocytic nevi of unspecified part of face: Secondary | ICD-10-CM | POA: Diagnosis not present

## 2018-06-19 DIAGNOSIS — D485 Neoplasm of uncertain behavior of skin: Secondary | ICD-10-CM | POA: Diagnosis not present

## 2018-06-20 DIAGNOSIS — H2513 Age-related nuclear cataract, bilateral: Secondary | ICD-10-CM | POA: Diagnosis not present

## 2018-06-20 DIAGNOSIS — H5203 Hypermetropia, bilateral: Secondary | ICD-10-CM | POA: Diagnosis not present

## 2018-07-10 DIAGNOSIS — S338XXA Sprain of other parts of lumbar spine and pelvis, initial encounter: Secondary | ICD-10-CM | POA: Diagnosis not present

## 2018-07-10 DIAGNOSIS — M9903 Segmental and somatic dysfunction of lumbar region: Secondary | ICD-10-CM | POA: Diagnosis not present

## 2018-08-13 DIAGNOSIS — M9903 Segmental and somatic dysfunction of lumbar region: Secondary | ICD-10-CM | POA: Diagnosis not present

## 2018-08-13 DIAGNOSIS — S338XXA Sprain of other parts of lumbar spine and pelvis, initial encounter: Secondary | ICD-10-CM | POA: Diagnosis not present

## 2018-09-30 DIAGNOSIS — Z124 Encounter for screening for malignant neoplasm of cervix: Secondary | ICD-10-CM | POA: Diagnosis not present

## 2018-09-30 DIAGNOSIS — N958 Other specified menopausal and perimenopausal disorders: Secondary | ICD-10-CM | POA: Diagnosis not present

## 2018-09-30 DIAGNOSIS — N952 Postmenopausal atrophic vaginitis: Secondary | ICD-10-CM | POA: Diagnosis not present

## 2018-09-30 DIAGNOSIS — M816 Localized osteoporosis [Lequesne]: Secondary | ICD-10-CM | POA: Diagnosis not present

## 2018-09-30 DIAGNOSIS — Z6826 Body mass index (BMI) 26.0-26.9, adult: Secondary | ICD-10-CM | POA: Diagnosis not present

## 2019-04-06 DIAGNOSIS — Z20828 Contact with and (suspected) exposure to other viral communicable diseases: Secondary | ICD-10-CM | POA: Diagnosis not present

## 2019-04-06 DIAGNOSIS — Z7189 Other specified counseling: Secondary | ICD-10-CM | POA: Diagnosis not present

## 2019-06-22 DIAGNOSIS — D225 Melanocytic nevi of trunk: Secondary | ICD-10-CM | POA: Diagnosis not present

## 2019-06-22 DIAGNOSIS — I8393 Asymptomatic varicose veins of bilateral lower extremities: Secondary | ICD-10-CM | POA: Diagnosis not present

## 2019-06-22 DIAGNOSIS — D229 Melanocytic nevi, unspecified: Secondary | ICD-10-CM | POA: Diagnosis not present

## 2019-06-22 DIAGNOSIS — D1801 Hemangioma of skin and subcutaneous tissue: Secondary | ICD-10-CM | POA: Diagnosis not present

## 2019-06-22 DIAGNOSIS — D2262 Melanocytic nevi of left upper limb, including shoulder: Secondary | ICD-10-CM | POA: Diagnosis not present

## 2019-06-22 DIAGNOSIS — L821 Other seborrheic keratosis: Secondary | ICD-10-CM | POA: Diagnosis not present

## 2019-06-22 DIAGNOSIS — Z1283 Encounter for screening for malignant neoplasm of skin: Secondary | ICD-10-CM | POA: Diagnosis not present

## 2019-06-22 DIAGNOSIS — Z85828 Personal history of other malignant neoplasm of skin: Secondary | ICD-10-CM | POA: Diagnosis not present

## 2019-06-22 DIAGNOSIS — L820 Inflamed seborrheic keratosis: Secondary | ICD-10-CM | POA: Diagnosis not present

## 2019-06-22 DIAGNOSIS — L578 Other skin changes due to chronic exposure to nonionizing radiation: Secondary | ICD-10-CM | POA: Diagnosis not present

## 2019-06-22 DIAGNOSIS — D2230 Melanocytic nevi of unspecified part of face: Secondary | ICD-10-CM | POA: Diagnosis not present

## 2019-06-22 DIAGNOSIS — L814 Other melanin hyperpigmentation: Secondary | ICD-10-CM | POA: Diagnosis not present

## 2019-06-29 DIAGNOSIS — Z4802 Encounter for removal of sutures: Secondary | ICD-10-CM | POA: Diagnosis not present

## 2019-06-29 DIAGNOSIS — D2262 Melanocytic nevi of left upper limb, including shoulder: Secondary | ICD-10-CM | POA: Diagnosis not present

## 2019-07-28 DIAGNOSIS — E7800 Pure hypercholesterolemia, unspecified: Secondary | ICD-10-CM | POA: Diagnosis not present

## 2019-07-28 DIAGNOSIS — Z0000 Encounter for general adult medical examination without abnormal findings: Secondary | ICD-10-CM | POA: Diagnosis not present

## 2019-07-28 DIAGNOSIS — Z1211 Encounter for screening for malignant neoplasm of colon: Secondary | ICD-10-CM | POA: Diagnosis not present

## 2019-07-28 DIAGNOSIS — F439 Reaction to severe stress, unspecified: Secondary | ICD-10-CM | POA: Diagnosis not present

## 2019-07-28 DIAGNOSIS — M25551 Pain in right hip: Secondary | ICD-10-CM | POA: Diagnosis not present

## 2019-07-28 DIAGNOSIS — Z1389 Encounter for screening for other disorder: Secondary | ICD-10-CM | POA: Diagnosis not present

## 2019-07-28 DIAGNOSIS — R2232 Localized swelling, mass and lump, left upper limb: Secondary | ICD-10-CM | POA: Diagnosis not present

## 2019-07-28 DIAGNOSIS — M8588 Other specified disorders of bone density and structure, other site: Secondary | ICD-10-CM | POA: Diagnosis not present

## 2019-08-05 DIAGNOSIS — Z23 Encounter for immunization: Secondary | ICD-10-CM | POA: Diagnosis not present

## 2019-08-11 DIAGNOSIS — H2513 Age-related nuclear cataract, bilateral: Secondary | ICD-10-CM | POA: Diagnosis not present

## 2019-08-11 DIAGNOSIS — H524 Presbyopia: Secondary | ICD-10-CM | POA: Diagnosis not present

## 2019-08-11 DIAGNOSIS — H04123 Dry eye syndrome of bilateral lacrimal glands: Secondary | ICD-10-CM | POA: Diagnosis not present

## 2019-08-21 DIAGNOSIS — S336XXA Sprain of sacroiliac joint, initial encounter: Secondary | ICD-10-CM | POA: Diagnosis not present

## 2019-08-21 DIAGNOSIS — M9905 Segmental and somatic dysfunction of pelvic region: Secondary | ICD-10-CM | POA: Diagnosis not present

## 2019-09-02 DIAGNOSIS — Z23 Encounter for immunization: Secondary | ICD-10-CM | POA: Diagnosis not present

## 2019-09-04 DIAGNOSIS — S336XXA Sprain of sacroiliac joint, initial encounter: Secondary | ICD-10-CM | POA: Diagnosis not present

## 2019-09-04 DIAGNOSIS — M9905 Segmental and somatic dysfunction of pelvic region: Secondary | ICD-10-CM | POA: Diagnosis not present

## 2019-09-09 DIAGNOSIS — S336XXA Sprain of sacroiliac joint, initial encounter: Secondary | ICD-10-CM | POA: Diagnosis not present

## 2019-09-09 DIAGNOSIS — M9905 Segmental and somatic dysfunction of pelvic region: Secondary | ICD-10-CM | POA: Diagnosis not present

## 2019-09-16 DIAGNOSIS — S336XXA Sprain of sacroiliac joint, initial encounter: Secondary | ICD-10-CM | POA: Diagnosis not present

## 2019-09-16 DIAGNOSIS — M9905 Segmental and somatic dysfunction of pelvic region: Secondary | ICD-10-CM | POA: Diagnosis not present

## 2019-09-30 DIAGNOSIS — S336XXA Sprain of sacroiliac joint, initial encounter: Secondary | ICD-10-CM | POA: Diagnosis not present

## 2019-09-30 DIAGNOSIS — M9905 Segmental and somatic dysfunction of pelvic region: Secondary | ICD-10-CM | POA: Diagnosis not present

## 2019-10-07 DIAGNOSIS — M9905 Segmental and somatic dysfunction of pelvic region: Secondary | ICD-10-CM | POA: Diagnosis not present

## 2019-10-07 DIAGNOSIS — S336XXA Sprain of sacroiliac joint, initial encounter: Secondary | ICD-10-CM | POA: Diagnosis not present

## 2019-11-28 ENCOUNTER — Encounter (HOSPITAL_COMMUNITY): Payer: Self-pay

## 2019-11-28 ENCOUNTER — Emergency Department (HOSPITAL_COMMUNITY): Payer: Medicare Other

## 2019-11-28 ENCOUNTER — Other Ambulatory Visit: Payer: Self-pay

## 2019-11-28 ENCOUNTER — Emergency Department (HOSPITAL_COMMUNITY)
Admission: EM | Admit: 2019-11-28 | Discharge: 2019-11-29 | Disposition: A | Discharge status: Home / Self Care | Payer: Medicare Other | Attending: Emergency Medicine | Admitting: Emergency Medicine

## 2019-11-28 DIAGNOSIS — Y999 Unspecified external cause status: Secondary | ICD-10-CM | POA: Diagnosis not present

## 2019-11-28 DIAGNOSIS — Y92007 Garden or yard of unspecified non-institutional (private) residence as the place of occurrence of the external cause: Secondary | ICD-10-CM | POA: Insufficient documentation

## 2019-11-28 DIAGNOSIS — M542 Cervicalgia: Secondary | ICD-10-CM | POA: Diagnosis not present

## 2019-11-28 DIAGNOSIS — Z87891 Personal history of nicotine dependence: Secondary | ICD-10-CM | POA: Diagnosis not present

## 2019-11-28 DIAGNOSIS — S01122A Laceration with foreign body of left eyelid and periocular area, initial encounter: Secondary | ICD-10-CM | POA: Diagnosis not present

## 2019-11-28 DIAGNOSIS — R55 Syncope and collapse: Secondary | ICD-10-CM | POA: Diagnosis not present

## 2019-11-28 DIAGNOSIS — S01112A Laceration without foreign body of left eyelid and periocular area, initial encounter: Secondary | ICD-10-CM | POA: Insufficient documentation

## 2019-11-28 DIAGNOSIS — S01422A Laceration with foreign body of left cheek and temporomandibular area, initial encounter: Secondary | ICD-10-CM | POA: Diagnosis not present

## 2019-11-28 DIAGNOSIS — W1839XA Other fall on same level, initial encounter: Secondary | ICD-10-CM | POA: Insufficient documentation

## 2019-11-28 DIAGNOSIS — S0181XA Laceration without foreign body of other part of head, initial encounter: Secondary | ICD-10-CM

## 2019-11-28 DIAGNOSIS — S80212A Abrasion, left knee, initial encounter: Secondary | ICD-10-CM | POA: Diagnosis not present

## 2019-11-28 DIAGNOSIS — S01412A Laceration without foreign body of left cheek and temporomandibular area, initial encounter: Secondary | ICD-10-CM | POA: Diagnosis not present

## 2019-11-28 DIAGNOSIS — T148XXA Other injury of unspecified body region, initial encounter: Secondary | ICD-10-CM

## 2019-11-28 DIAGNOSIS — Z79899 Other long term (current) drug therapy: Secondary | ICD-10-CM | POA: Diagnosis not present

## 2019-11-28 DIAGNOSIS — S199XXA Unspecified injury of neck, initial encounter: Secondary | ICD-10-CM | POA: Diagnosis not present

## 2019-11-28 DIAGNOSIS — W19XXXA Unspecified fall, initial encounter: Secondary | ICD-10-CM

## 2019-11-28 DIAGNOSIS — S0003XA Contusion of scalp, initial encounter: Secondary | ICD-10-CM | POA: Diagnosis not present

## 2019-11-28 DIAGNOSIS — E041 Nontoxic single thyroid nodule: Secondary | ICD-10-CM | POA: Insufficient documentation

## 2019-11-28 DIAGNOSIS — Y9389 Activity, other specified: Secondary | ICD-10-CM | POA: Insufficient documentation

## 2019-11-28 DIAGNOSIS — S40212A Abrasion of left shoulder, initial encounter: Secondary | ICD-10-CM | POA: Diagnosis not present

## 2019-11-28 DIAGNOSIS — D497 Neoplasm of unspecified behavior of endocrine glands and other parts of nervous system: Secondary | ICD-10-CM

## 2019-11-28 DIAGNOSIS — S0083XA Contusion of other part of head, initial encounter: Secondary | ICD-10-CM | POA: Diagnosis not present

## 2019-11-28 LAB — CBC WITH DIFFERENTIAL/PLATELET
Abs Immature Granulocytes: 0.04 10*3/uL (ref 0.00–0.07)
Basophils Absolute: 0.1 10*3/uL (ref 0.0–0.1)
Basophils Relative: 1 %
Eosinophils Absolute: 0.1 10*3/uL (ref 0.0–0.5)
Eosinophils Relative: 1 %
HCT: 38.4 % (ref 36.0–46.0)
Hemoglobin: 12.6 g/dL (ref 12.0–15.0)
Immature Granulocytes: 1 %
Lymphocytes Relative: 18 %
Lymphs Abs: 1.4 10*3/uL (ref 0.7–4.0)
MCH: 30.7 pg (ref 26.0–34.0)
MCHC: 32.8 g/dL (ref 30.0–36.0)
MCV: 93.7 fL (ref 80.0–100.0)
Monocytes Absolute: 0.4 10*3/uL (ref 0.1–1.0)
Monocytes Relative: 6 %
Neutro Abs: 5.8 10*3/uL (ref 1.7–7.7)
Neutrophils Relative %: 73 %
Platelets: 258 10*3/uL (ref 150–400)
RBC: 4.1 MIL/uL (ref 3.87–5.11)
RDW: 13.1 % (ref 11.5–15.5)
WBC: 7.7 10*3/uL (ref 4.0–10.5)
nRBC: 0 % (ref 0.0–0.2)

## 2019-11-28 LAB — BASIC METABOLIC PANEL
Anion gap: 11 (ref 5–15)
BUN: 19 mg/dL (ref 8–23)
CO2: 21 mmol/L — ABNORMAL LOW (ref 22–32)
Calcium: 9.2 mg/dL (ref 8.9–10.3)
Chloride: 106 mmol/L (ref 98–111)
Creatinine, Ser: 1 mg/dL (ref 0.44–1.00)
GFR calc Af Amer: >60 mL/min (ref >60)
GFR calc non Af Amer: 57 mL/min — ABNORMAL LOW (ref >60)
Glucose, Bld: 107 mg/dL — ABNORMAL HIGH (ref 70–99)
Potassium: 3.7 mmol/L (ref 3.5–5.1)
Sodium: 138 mmol/L (ref 135–145)

## 2019-11-28 MED ORDER — LIDOCAINE-EPINEPHRINE (PF) 2 %-1:200000 IJ SOLN
10.0000 mL | Freq: Once | INTRAMUSCULAR | Status: AC
  Administered 2019-11-28: 10 mL via INTRADERMAL
  Filled 2019-11-28: qty 20

## 2019-11-28 MED ORDER — LIDOCAINE-EPINEPHRINE 2 %-1:100000 IJ SOLN
20.0000 mL | Freq: Once | INTRAMUSCULAR | Status: DC
  Filled 2019-11-28: qty 20

## 2019-11-28 MED ORDER — SODIUM CHLORIDE 0.9 % IV BOLUS
1000.0000 mL | Freq: Once | INTRAVENOUS | Status: AC
  Administered 2019-11-28: 500 mL via INTRAVENOUS

## 2019-11-28 MED ORDER — CELECOXIB 200 MG PO CAPS
200.0000 mg | ORAL_CAPSULE | Freq: Two times a day (BID) | ORAL | 0 refills | Status: AC
Start: 2019-11-28 — End: ?

## 2019-11-28 MED ORDER — METHOCARBAMOL 500 MG PO TABS
500.0000 mg | ORAL_TABLET | Freq: Two times a day (BID) | ORAL | 0 refills | Status: AC
Start: 2019-11-28 — End: ?

## 2019-11-28 NOTE — ED Triage Notes (Signed)
Pt bib ems, arriving after a fall with a lac on the left upper head. Pt is aox4 upon arrival. Pt was gardening outside and was potentially overexposed to heat, was dizzzy, went inside for some fluids then came back out then fell after getting light headed. Pt squatted closer to the ground and then fell forward.  122/82 98% 92hr

## 2019-11-28 NOTE — ED Notes (Signed)
Pt ambulated to the restroom without any assistance.

## 2019-11-28 NOTE — Discharge Instructions (Addendum)
Continue to follow-up with your primary care physician to schedule an outpatient ultrasound.  There is a very small incidental finding of a thyroid nodule.  Please also follow-up in 5 to 7 days for suture removal.  Read the attached information about tissue adhesive wound care.  Return for any new or worsening symptoms.  WOUND CARE   Please have your stitches/staples removed in 5-7 days or sooner if you have concerns. You may do this at any available urgent care or at your primary care doctor's office.  Keep area clean and dry for 24 hours. Do not remove bandage, if applied.  After 24 hours, remove bandage and wash wound gently with mild soap and warm water. Reapply a new bandage after cleaning wound, if directed.  Continue daily cleansing with soap and water until stitches/staples are removed.  Do not apply any ointments or creams to the wound while stitches/staples are in place, as this may cause delayed healing.  Seek medical careif you experience any of the following signs of infection: Swelling, redness, pus drainage, streaking, fever >101.0 F  Seek care if you experience excessive bleeding that does not stop after 15-20 minutes of constant, firm pressure.

## 2019-11-28 NOTE — ED Provider Notes (Signed)
[facility] EMERGENCY DEPARTMENT Provider Note   CSN: [id number] Arrival date & time: 11/28/19  2040     History Chief Complaint  Patient presents with  . Fall    Monica Franklin is a 69 y.o. female.  Who presents emergency department for evaluation of injury to her face after presyncope and fall.  Patient states that she has been under a lot of stress because her husband died 4 weeks ago.  She hasn't been eating or drinking well.  Today her stepson came over to help her finish a project.  She states that she was trying to put off writing thank you letters and came outside to help him move yard supplies.  She states that she was wearing a sweatshirt and it was quite hot and she felt lightheaded several times.  She wanted to get some water, came back out and was moving bricks.  She began to get very dizzy felt like she was going to pass out.  She squatted down however she was headed downhill and she fell forward onto her left face, shoulder and knee.  She states that she does not believe she fully lost consciousness.  She is not on any blood thinners.  She is not up-to-date on her tetanus vaccination.  She suffered injuries to the left side of her face.  She complains of shoulder pain close to her neck.  She denies any numbness or tingling.  She is right-hand dominant.  Shoulder pain is on the left.  She has an abrasion to the left knee.  She was ambulatory after the incident.  HPI     Past Medical History:  Diagnosis Date  . Arthritis    wrists  . Chest pain   . Hyperlipidemia   . SOB (shortness of breath)     Patient Active Problem List   Diagnosis Date Noted  . Familial hyperlipidemia 07/08/2015  . Family history of ischemic heart disease 07/08/2015    Past Surgical History:  Procedure Laterality Date  . ANAL FISSURE REPAIR    . BREAST ENHANCEMENT SURGERY    . NASAL RECONSTRUCTION    . SHOULDER ARTHROSCOPY WITH ROTATOR CUFF REPAIR AND SUBACROMIAL DECOMPRESSION  Left 03/19/2016   Procedure: SHOULDER ARTHROSCOPY ROTATOR CUFF REPAIR, SUBACROMIAL DECOMPRESSION;  Surgeon: Tania Ade, MD;  Location: [place];  Service: Orthopedics;  Laterality: Left;  SHOULDER ARTHROSCOPY ROTATOR CUFF REPAIR,  SUBACROMIAL DECOMPRESSION  . TONSILLECTOMY    . TUBAL LIGATION       OB History   No obstetric history on file.     Family History  Problem Relation Age of Onset  . Heart attack Father   . Colon cancer Mother   . Colon cancer Sister        LUNG AND BRAIN  . COPD Brother     Social History   Tobacco Use  . Smoking status: Former Smoker    Quit date: 08/30/2013    Years since quitting: 6.2  . Smokeless tobacco: Never Used  Substance Use Topics  . Alcohol use: Yes    Comment: social  . Drug use: No    Home Medications Prior to Admission medications   Medication Sig Start Date End Date Taking? Authorizing Provider  ASTAXANTHIN PO Take 325 mg by mouth daily.     [provider]  Azelaic Acid (FINACEA EX) Apply 1 application topically daily. For Rosecea    [provider]  celecoxib (CELEBREX) 200 MG capsule Take 1  capsule (200 mg total) by mouth 2 (two) times daily. 11/28/19   Margarita Mail, PA-C  docusate sodium (COLACE) 100 MG capsule Take 1 capsule (100 mg total) by mouth 3 (three) times daily as needed. 03/19/16   Grier Mitts, PA-C  doxycycline (VIBRAMYCIN) 50 MG capsule Take 50 mg by mouth daily as needed.  08/03/13   [provider]  Ivermectin (SOOLANTRA EX) Apply 1 application topically daily. For Rosacea    [provider]  Magnesium 500 MG CAPS Take 1 capsule by mouth daily. Magnesium L Threonate    [provider]  methocarbamol (ROBAXIN) 500 MG tablet Take 1 tablet (500 mg total) by mouth 2 (two) times daily. 11/28/19   Margarita Mail, PA-C  Omega-3 Krill Oil 500 MG CAPS Take by mouth.    [provider]  oxyCODONE-acetaminophen (ROXICET) 5-325 MG tablet Take  1-2 tablets by mouth every 4 (four) hours as needed for severe pain. 03/19/16   Grier Mitts, PA-C  Potassium (POTASSIMIN PO) Take 500 mg by mouth daily.     [provider]  pravastatin (PRAVACHOL) 20 MG tablet Take 1 tablet (20 mg total) by mouth every evening. 08/01/16 10/30/16  Jerline Pain, MD  Ubiquinol 100 MG CAPS Take 2 tablets by mouth daily.    [provider]  UNABLE TO FIND Take 1 tablet by mouth every evening. Kavinace    [provider]  UNABLE TO FIND Med Name: hemp oil    [provider]    Allergies    Crestor [rosuvastatin calcium] and Sulfa antibiotics  Review of Systems   Review of Systems Ten systems reviewed and are negative for acute change, except as noted in the HPI.   Physical Exam Updated Vital Signs BP (!) 144/74 (BP Location: Right Arm)   Pulse 75   Temp 97.9 F (36.6 C) (Oral)   Resp 16   Ht 5\' 7"  (1.702 m)   Wt 62.6 kg   SpO2 97%   BMI 21.61 kg/m   Physical Exam Vitals and nursing note reviewed.  Constitutional:      General: She is not in acute distress.    Appearance: She is well-developed. She is not diaphoretic.  HENT:     Head: Normocephalic.     Comments:   Puncture wound above the left eyebrow, laceration along the left side of the face Eyes:     General: No scleral icterus.    Conjunctiva/sclera: Conjunctivae normal.  Cardiovascular:     Rate and Rhythm: Normal rate and regular rhythm.     Heart sounds: Normal heart sounds. No murmur heard.  No friction rub. No gallop.   Pulmonary:     Effort: Pulmonary effort is normal. No respiratory distress.     Breath sounds: Normal breath sounds.  Abdominal:     General: Bowel sounds are normal. There is no distension.     Palpations: Abdomen is soft. There is no mass.     Tenderness: There is no abdominal tenderness. There is no guarding.  Musculoskeletal:     Cervical back: Normal range of motion.     Comments: Full range of motion of the left  shoulder and left knee, no obvious deformity.  Skin:    General: Skin is warm and dry.     Comments: Abrasion to the left knee, left shoulder  Neurological:     General: No focal deficit present.     Mental Status: She is alert and oriented to person, place,  and time.  Psychiatric:        Behavior: Behavior normal.     ED Results / Procedures / Treatments   Labs (all labs ordered are listed, but only abnormal results are displayed) Labs Reviewed  BASIC METABOLIC PANEL - Abnormal; Notable for the following components:      Result Value   CO2 21 (*)    Glucose, Bld 107 (*)    GFR calc non Af Amer 57 (*)    All other components within normal limits  CBC WITH DIFFERENTIAL/PLATELET    EKG EKG Interpretation  Date/Time:  Saturday November 28 2019 20:47:26 EDT Ventricular Rate:  91 PR Interval:    QRS Duration: 92 QT Interval:  373 QTC Calculation: 459 R Axis:   71 Text Interpretation: Sinus rhythm Borderline T wave abnormalities When compared with ECG of 07/18/2015, No significant change was found Confirmed by Delora Fuel ([id number]) on 11/28/2019 9:01:37 PM Also confirmed by Delora Fuel ([id number]), editor Bryant, LaVerne [phone])  on 11/29/2019 7:25:00 AM   Radiology CT HEAD WO CONTRAST  Result Date: 11/28/2019 CLINICAL DATA:  Syncopal event during yard work, fall to left shoulder, now with neck pain EXAM: CT HEAD WITHOUT CONTRAST CT CERVICAL SPINE WITHOUT CONTRAST TECHNIQUE: Multidetector CT imaging of the head and cervical spine was performed following the standard protocol without intravenous contrast. Multiplanar CT image reconstructions of the cervical spine were also generated. COMPARISON:  None. FINDINGS: CT HEAD FINDINGS Brain: No evidence of acute infarction, hemorrhage, hydrocephalus, extra-axial collection or mass lesion/mass effect. Symmetric prominence of the ventricles, cisterns and sulci compatible with mild senescent parenchymal volume loss. Patchy areas of white matter  hypoattenuation are most compatible with chronic microvascular angiopathy. Vascular: Atherosclerotic calcification of the carotid siphons. No hyperdense vessel. Skull: Left frontal scalp laceration with small amount of soft tissue gas with associated thickening and contusive change and thin crescentic hematoma measuring up to 3 mm in maximal thickness. No subjacent calvarial fracture. Additional laceration and soft tissue gas extending across the left lateral periorbital soft tissues and malar tissues with punctate radiodensity (4/29) possibly reflecting some debris. No other acute osseous injuries of the calvaria or included facial bones. Benign hyperostosis frontalis interna. Sinuses/Orbits: Small osteoma in the left sphenoid sinus, typically benign incidental finding. Paranasal sinuses are otherwise predominantly clear. No retro septal gas, stranding or hemorrhage. The globes appear normal and symmetric. Lenses are orthotopic. Symmetric appearance of the extraocular musculature and optic nerve sheath complexes. Normal caliber of the superior ophthalmic veins. Other: None CT CERVICAL SPINE FINDINGS Alignment: Stabilization collar is absent at the time of examination. Mild left neck flexion. Straightening and mild focal reversal of the cervical lordosis at C5-6 with bony fusion of the vertebral bodies and left posterior elements. Mild stepwise anterolisthesis C2-C5 and C7-T1 are favored to be on a degenerative basis given advanced facet arthropathy at these levels. No evidence of traumatic listhesis. No abnormally widened, perched or jumped facets. Normal alignment of the craniocervical and atlantoaxial articulations. Skull base and vertebrae: No acute skull base fracture. No vertebral body fractures or height loss. Bony fusion of the C5-6 vertebrae and left facets, as above. Benign hemangiomata seen at C6 and T1. No worrisome osseous lesions. Soft tissues and spinal canal: No pre or paravertebral fluid or  swelling. No visible canal hematoma. Disc levels: Multilevel intervertebral disc height loss with spondylitic endplate changes. Large posterior ridge at the fused C5-6 levels results in effacement the ventral thecal sac and mild indentation of the ventral cord  compatible with at least moderate stenosis. Additional disc osteophyte complex C6-7 results in some mild canal stenosis as well. Multilevel uncinate spurring and facet hypertrophic changes result in mild-to-moderate bilateral foraminal narrowing more pronounced at the C6-7 levels where changes are more moderate to severe. Upper chest: No acute abnormality in the upper chest or imaged lung apices. Biapical pleuroparenchymal scarring. Bubbly lucencies likely reflecting small amount of paraseptal emphysematous change in the medial left apex. Other: 2.3 cm hypoattenuating nodule in the right lobe thyroid gland. Cervical carotid atherosclerosis. IMPRESSION: 1. No acute intracranial abnormality. 2. Left frontal scalp contusion and laceration with small amount of soft tissue gas and thin crescentic hematoma measuring up to 3 mm in maximal thickness. No subjacent calvarial fracture. 3. Additional laceration and soft tissue gas extending across the left lateral periorbital soft tissues and malar tissues with punctate radiodensity possibly reflecting some debris. 4. No other acute osseous injuries of the calvaria or included facial bones. 5. No acute cervical spine fracture or traumatic listhesis. 6. Bony fusion of the C5-6 vertebrae and left posterior elements. 7. Multilevel degenerative changes of the cervical spine, as described above. 8. 2.3 cm hypoattenuating nodule in the right lobe thyroid gland. Recommend further evaluation with thyroid ultrasound on a nonemergent basis. This follows consensus guidelines: Managing Incidental Thyroid Nodules Detected on Imaging: White Paper of the ACR Incidental Thyroid Findings Committee. J Am Coll Radiol 2015; 12:143-150. and  [facility] 3-tiered system for managing ITNs: J Am Coll Radiol. 2015; Feb;12(2): 143-50 9. Cervical and intracranial atherosclerosis. Electronically Signed   By: Lovena Le M.D.   On: 11/28/2019 21:51   CT CERVICAL SPINE WO CONTRAST  Result Date: 11/28/2019 CLINICAL DATA:  Syncopal event during yard work, fall to left shoulder, now with neck pain EXAM: CT HEAD WITHOUT CONTRAST CT CERVICAL SPINE WITHOUT CONTRAST TECHNIQUE: Multidetector CT imaging of the head and cervical spine was performed following the standard protocol without intravenous contrast. Multiplanar CT image reconstructions of the cervical spine were also generated. COMPARISON:  None. FINDINGS: CT HEAD FINDINGS Brain: No evidence of acute infarction, hemorrhage, hydrocephalus, extra-axial collection or mass lesion/mass effect. Symmetric prominence of the ventricles, cisterns and sulci compatible with mild senescent parenchymal volume loss. Patchy areas of white matter hypoattenuation are most compatible with chronic microvascular angiopathy. Vascular: Atherosclerotic calcification of the carotid siphons. No hyperdense vessel. Skull: Left frontal scalp laceration with small amount of soft tissue gas with associated thickening and contusive change and thin crescentic hematoma measuring up to 3 mm in maximal thickness. No subjacent calvarial fracture. Additional laceration and soft tissue gas extending across the left lateral periorbital soft tissues and malar tissues with punctate radiodensity (4/29) possibly reflecting some debris. No other acute osseous injuries of the calvaria or included facial bones. Benign hyperostosis frontalis interna. Sinuses/Orbits: Small osteoma in the left sphenoid sinus, typically benign incidental finding. Paranasal sinuses are otherwise predominantly clear. No retro septal gas, stranding or hemorrhage. The globes appear normal and symmetric. Lenses are orthotopic. Symmetric appearance of the extraocular musculature and  optic nerve sheath complexes. Normal caliber of the superior ophthalmic veins. Other: None CT CERVICAL SPINE FINDINGS Alignment: Stabilization collar is absent at the time of examination. Mild left neck flexion. Straightening and mild focal reversal of the cervical lordosis at C5-6 with bony fusion of the vertebral bodies and left posterior elements. Mild stepwise anterolisthesis C2-C5 and C7-T1 are favored to be on a degenerative basis given advanced facet arthropathy at these levels. No evidence of traumatic listhesis. No abnormally  widened, perched or jumped facets. Normal alignment of the craniocervical and atlantoaxial articulations. Skull base and vertebrae: No acute skull base fracture. No vertebral body fractures or height loss. Bony fusion of the C5-6 vertebrae and left facets, as above. Benign hemangiomata seen at C6 and T1. No worrisome osseous lesions. Soft tissues and spinal canal: No pre or paravertebral fluid or swelling. No visible canal hematoma. Disc levels: Multilevel intervertebral disc height loss with spondylitic endplate changes. Large posterior ridge at the fused C5-6 levels results in effacement the ventral thecal sac and mild indentation of the ventral cord compatible with at least moderate stenosis. Additional disc osteophyte complex C6-7 results in some mild canal stenosis as well. Multilevel uncinate spurring and facet hypertrophic changes result in mild-to-moderate bilateral foraminal narrowing more pronounced at the C6-7 levels where changes are more moderate to severe. Upper chest: No acute abnormality in the upper chest or imaged lung apices. Biapical pleuroparenchymal scarring. Bubbly lucencies likely reflecting small amount of paraseptal emphysematous change in the medial left apex. Other: 2.3 cm hypoattenuating nodule in the right lobe thyroid gland. Cervical carotid atherosclerosis. IMPRESSION: 1. No acute intracranial abnormality. 2. Left frontal scalp contusion and laceration  with small amount of soft tissue gas and thin crescentic hematoma measuring up to 3 mm in maximal thickness. No subjacent calvarial fracture. 3. Additional laceration and soft tissue gas extending across the left lateral periorbital soft tissues and malar tissues with punctate radiodensity possibly reflecting some debris. 4. No other acute osseous injuries of the calvaria or included facial bones. 5. No acute cervical spine fracture or traumatic listhesis. 6. Bony fusion of the C5-6 vertebrae and left posterior elements. 7. Multilevel degenerative changes of the cervical spine, as described above. 8. 2.3 cm hypoattenuating nodule in the right lobe thyroid gland. Recommend further evaluation with thyroid ultrasound on a nonemergent basis. This follows consensus guidelines: Managing Incidental Thyroid Nodules Detected on Imaging: White Paper of the ACR Incidental Thyroid Findings Committee. J Am Coll Radiol 2015; 12:143-150. and [facility] 3-tiered system for managing ITNs: J Am Coll Radiol. 2015; Feb;12(2): 143-50 9. Cervical and intracranial atherosclerosis. Electronically Signed   By: Lovena Le M.D.   On: 11/28/2019 21:51    Procedures .Marland KitchenLaceration Repair  Date/Time: 11/29/2019 12:08 AM Performed by: Margarita Mail, PA-C Authorized by: Margarita Mail, PA-C   Consent:    Consent obtained:  Verbal   Consent given by:  Patient   Risks discussed:  Infection, need for additional repair, pain, poor cosmetic result and poor wound healing   Alternatives discussed:  No treatment and delayed treatment Universal protocol:    Procedure explained and questions answered to patient or proxy's satisfaction: yes     Relevant documents present and verified: yes     Test results available and properly labeled: yes     Imaging studies available: yes     Required blood products, implants, devices, and special equipment available: yes     Site/side marked: yes     Immediately prior to procedure, a time out was called:  yes     Patient identity confirmed:  Verbally with patient Anesthesia (see MAR for exact dosages):    Anesthesia method:  Local infiltration   Local anesthetic:  Lidocaine 1% WITH epi Laceration details:    Location:  Face   Face location:  L eyebrow   Length (cm):  2   Depth (mm):  10 Repair type:    Repair type:  Intermediate Pre-procedure details:    Preparation:  Patient was prepped and draped in usual sterile fashion Exploration:    Wound exploration: wound explored through full range of motion and entire depth of wound probed and visualized     Wound extent: foreign bodies/material and muscle damage     Foreign bodies/material:  Dirt and debris   Contaminated: yes   Treatment:    Area cleansed with:  Betadine and saline   Amount of cleaning:  Extensive   Irrigation solution:  Sterile saline   Irrigation method:  Pressure wash   Visualized foreign bodies/material removed: yes   Subcutaneous repair:    Suture size:  5-0   Suture material:  Vicryl   Suture technique:  Simple interrupted   Number of sutures:  1 Skin repair:    Repair method:  Sutures   Suture size:  5-0   Suture material:  Nylon   Suture technique:  Simple interrupted   Number of sutures:  2 Approximation:    Approximation:  Close Post-procedure details:    Dressing:  Adhesive bandage   Patient tolerance of procedure:  Tolerated well, no immediate complications .Marland KitchenLaceration Repair  Date/Time: 11/29/2019 12:10 AM Performed by: Margarita Mail, PA-C Authorized by: Margarita Mail, PA-C   Consent:    Consent obtained:  Verbal   Consent given by:  Patient   Risks discussed:  Infection, need for additional repair, pain, poor cosmetic result and poor wound healing   Alternatives discussed:  No treatment and delayed treatment Universal protocol:    Procedure explained and questions answered to patient or proxy's satisfaction: yes     Relevant documents present and verified: yes     Test results  available and properly labeled: yes     Imaging studies available: yes     Required blood products, implants, devices, and special equipment available: yes     Site/side marked: yes     Immediately prior to procedure, a time out was called: yes     Patient identity confirmed:  Verbally with patient Anesthesia (see MAR for exact dosages):    Anesthesia method:  Local infiltration   Local anesthetic:  Lidocaine 1% WITH epi Laceration details:    Location:  Face   Face location:  L cheek   Length (cm):  6   Depth (mm):  20 Repair type:    Repair type:  Intermediate Pre-procedure details:    Preparation:  Patient was prepped and draped in usual sterile fashion Exploration:    Wound exploration: wound explored through full range of motion and entire depth of wound probed and visualized     Wound extent: foreign bodies/material     Foreign bodies/material:  Dirt and debris   Contaminated: yes   Treatment:    Area cleansed with:  Betadine and saline   Amount of cleaning:  Extensive   Irrigation solution:  Sterile saline   Irrigation method:  Pressure wash   Visualized foreign bodies/material removed: yes   Skin repair:    Repair method:  Sutures and tissue adhesive   Suture size:  5-0   Suture material:  Nylon   Suture technique:  Simple interrupted   Number of sutures:  3 Post-procedure details:    Dressing:  Open (no dressing) .Marland KitchenLaceration Repair  Date/Time: 11/29/2019 12:12 AM Performed by: Margarita Mail, PA-C Authorized by: Margarita Mail, PA-C   Consent:    Consent obtained:  Verbal   Consent given by:  Patient   Risks discussed:  Infection, need for additional repair, pain, poor cosmetic  result and poor wound healing   Alternatives discussed:  No treatment and delayed treatment Universal protocol:    Procedure explained and questions answered to patient or proxy's satisfaction: yes     Relevant documents present and verified: yes     Test results available and  properly labeled: yes     Imaging studies available: yes     Required blood products, implants, devices, and special equipment available: yes     Site/side marked: yes     Immediately prior to procedure, a time out was called: yes     Patient identity confirmed:  Verbally with patient Anesthesia (see MAR for exact dosages):    Anesthesia method:  Local infiltration   Local anesthetic:  Lidocaine 1% WITH epi Laceration details:    Location:  Face   Face location:  L cheek   Length (cm):  2   Depth (mm):  3 Repair type:    Repair type:  Simple Exploration:    Wound exploration: wound explored through full range of motion and entire depth of wound probed and visualized     Contaminated: no   Treatment:    Area cleansed with:  Betadine and saline   Amount of cleaning:  Standard   Irrigation solution:  Sterile saline   Irrigation method:  Syringe Skin repair:    Repair method:  Sutures and tissue adhesive   Suture size:  5-0   Suture material:  Nylon   Suture technique:  Simple interrupted   Number of sutures:  1 Approximation:    Approximation:  Close Post-procedure details:    Dressing:  Open (no dressing)   Patient tolerance of procedure:  Tolerated well, no immediate complications   (including critical care time)  Medications Ordered in ED Medications  sodium chloride 0.9 % bolus 1,000 mL (0 mLs Intravenous Stopped 11/29/19 0001)  lidocaine-EPINEPHrine (XYLOCAINE W/EPI) 2 %-1:200000 (PF) injection 10 mL (10 mLs Intradermal Given 11/28/19 2255)  lidocaine-EPINEPHrine (XYLOCAINE W/EPI) 2 %-1:200000 (PF) injection 10 mL (10 mLs Intradermal Given 11/28/19 2255)    ED Course  I have reviewed the triage vital signs and the nursing notes.  Pertinent labs & imaging results that were available during my care of the patient were reviewed by me and considered in my medical decision making (see chart for details).    MDM Rules/Calculators/A&P                          CC: near  syncope, facial trauma VS: BP (!) 144/74 (BP Location: Right Arm)   Pulse 75   Temp 97.9 F (36.6 C) (Oral)   Resp 16   Ht 5\' 7"  (1.702 m)   Wt 62.6 kg   SpO2 97%   BMI 21.61 kg/m  EZ:MOQHUTM is gathered by patient  and family at bedside. Previous records obtained and reviewed. DDX:The patient's complaint of near syncope involves an extensive number of diagnostic and treatment options, and is a complaint that carries with it a high risk of complications, morbidity, and potential mortality. Given the large differential diagnosis, medical decision making is of high complexity. The emergent differential diagnosis for shortness of breath includes, but is not limited to, Pulmonary edema, bronchoconstriction, Pneumonia, Pulmonary embolism, Pneumotherax/ Hemothorax, Dysrythmia, ACS.   Labs: I ordered reviewed and interpreted labs which included bmp with mildly elevated glucose of insignificant value, CBC wnl- no anemia Imaging: I ordered and reviewed images which included CT head and cspine. I independently  visualized and interpreted all imaging. There are no acute, significant findings on today's images. Incidental Thyroid Nodule visualized EKG:NSR rate 91 without arrhythmia Consults:N/A MDM: Patient with near syncope and facial trauma. She has a low risk SFSR score. Imaging negative. Wounds requiring extensive debridement. PATIENT INFORMED OF POSSIBILITY OF RETAINED FOREIGN MATERIAL EVEN AFTER CLEANSING AND DBRIDEMENT. Discussed incidentaloma on thyroid and given specific verbal and written instruction on pcp f/u for op thyroid US. Patient given wound care and OP f/u / return instruction.  Ambulatory and HDS at discharge. Given 500 ml NS with improvement in sxs. Patient disposition:discharge The patient appears reasonably screened and/or stabilized for discharge and I doubt any other medical condition or other [facility] requiring further screening, evaluation, or treatment in the ED at this time prior  to discharge. I have discussed lab and/or imaging findings with the patient and answered all questions/concerns to the best of my ability.I have discussed return precautions and OP follow up.    Final Clinical Impression(s) / ED Diagnoses Final diagnoses:  Fall, initial encounter  Facial laceration, initial encounter  Incidentaloma of thyroid gland  Abrasion    Rx / DC Orders ED Discharge Orders         Ordered    celecoxib (CELEBREX) 200 MG capsule  2 times daily     Discontinue  Reprint     11/28/19 2341    methocarbamol (ROBAXIN) 500 MG tablet  2 times daily     Discontinue  Reprint     11/28/19 2341           Margarita Mail, PA-C 45/99/77 [id number]    Delora Fuel, MD 39/53/20 [phone]

## 2019-11-29 NOTE — ED Notes (Signed)
Patient verbalizes understanding of discharge instructions. Opportunity for questioning and answers were provided. Armband removed by staff, pt discharged from ED ambulatory.   

## 2019-12-11 DIAGNOSIS — E042 Nontoxic multinodular goiter: Secondary | ICD-10-CM | POA: Diagnosis not present

## 2019-12-11 DIAGNOSIS — E041 Nontoxic single thyroid nodule: Secondary | ICD-10-CM | POA: Diagnosis not present

## 2019-12-14 ENCOUNTER — Other Ambulatory Visit: Payer: Self-pay | Admitting: Physician Assistant

## 2019-12-14 DIAGNOSIS — E041 Nontoxic single thyroid nodule: Secondary | ICD-10-CM

## 2019-12-22 ENCOUNTER — Other Ambulatory Visit (HOSPITAL_COMMUNITY)
Admission: RE | Admit: 2019-12-22 | Discharge: 2019-12-22 | Disposition: A | Discharge status: Home / Self Care | Payer: Medicare Other | Source: Ambulatory Visit | Attending: Student | Admitting: Student

## 2019-12-22 ENCOUNTER — Ambulatory Visit
Admission: RE | Admit: 2019-12-22 | Discharge: 2019-12-22 | Disposition: A | Discharge status: Home / Self Care | Payer: Medicare Other | Source: Ambulatory Visit | Attending: Physician Assistant | Admitting: Physician Assistant

## 2019-12-22 DIAGNOSIS — E041 Nontoxic single thyroid nodule: Secondary | ICD-10-CM | POA: Diagnosis not present

## 2019-12-23 LAB — CYTOLOGY - NON PAP

## 2019-12-29 DIAGNOSIS — E041 Nontoxic single thyroid nodule: Secondary | ICD-10-CM | POA: Diagnosis not present

## 2020-02-29 DIAGNOSIS — Z23 Encounter for immunization: Secondary | ICD-10-CM | POA: Diagnosis not present

## 2020-03-07 DIAGNOSIS — R197 Diarrhea, unspecified: Secondary | ICD-10-CM | POA: Diagnosis not present

## 2020-03-07 DIAGNOSIS — M6289 Other specified disorders of muscle: Secondary | ICD-10-CM | POA: Diagnosis not present

## 2020-03-07 DIAGNOSIS — R079 Chest pain, unspecified: Secondary | ICD-10-CM | POA: Diagnosis not present

## 2020-03-07 DIAGNOSIS — E041 Nontoxic single thyroid nodule: Secondary | ICD-10-CM | POA: Diagnosis not present

## 2020-03-25 ENCOUNTER — Other Ambulatory Visit: Payer: Self-pay | Admitting: Gastroenterology

## 2020-03-25 DIAGNOSIS — R634 Abnormal weight loss: Secondary | ICD-10-CM

## 2020-03-25 DIAGNOSIS — R110 Nausea: Secondary | ICD-10-CM | POA: Diagnosis not present

## 2020-03-25 DIAGNOSIS — R1084 Generalized abdominal pain: Secondary | ICD-10-CM

## 2020-03-25 DIAGNOSIS — K529 Noninfective gastroenteritis and colitis, unspecified: Secondary | ICD-10-CM | POA: Diagnosis not present

## 2020-04-06 ENCOUNTER — Other Ambulatory Visit: Payer: Self-pay | Admitting: Internal Medicine

## 2020-04-06 DIAGNOSIS — E041 Nontoxic single thyroid nodule: Secondary | ICD-10-CM

## 2020-04-07 ENCOUNTER — Ambulatory Visit
Admission: RE | Admit: 2020-04-07 | Discharge: 2020-04-07 | Disposition: A | Discharge status: Home / Self Care | Payer: Medicare Other | Source: Ambulatory Visit | Attending: Gastroenterology | Admitting: Gastroenterology

## 2020-04-07 DIAGNOSIS — R634 Abnormal weight loss: Secondary | ICD-10-CM | POA: Diagnosis not present

## 2020-04-07 DIAGNOSIS — R110 Nausea: Secondary | ICD-10-CM | POA: Diagnosis not present

## 2020-04-07 DIAGNOSIS — K5730 Diverticulosis of large intestine without perforation or abscess without bleeding: Secondary | ICD-10-CM | POA: Diagnosis not present

## 2020-04-07 DIAGNOSIS — R630 Anorexia: Secondary | ICD-10-CM | POA: Diagnosis not present

## 2020-04-07 DIAGNOSIS — R1084 Generalized abdominal pain: Secondary | ICD-10-CM

## 2020-04-07 MED ORDER — IOPAMIDOL (ISOVUE-300) INJECTION 61%
100.0000 mL | Freq: Once | INTRAVENOUS | Status: AC | PRN
  Administered 2020-04-07: 100 mL via INTRAVENOUS

## 2020-04-19 ENCOUNTER — Ambulatory Visit
Admission: RE | Admit: 2020-04-19 | Discharge: 2020-04-19 | Disposition: A | Discharge status: Home / Self Care | Payer: Medicare Other | Source: Ambulatory Visit | Attending: Internal Medicine | Admitting: Internal Medicine

## 2020-04-19 ENCOUNTER — Other Ambulatory Visit (HOSPITAL_COMMUNITY)
Admission: RE | Admit: 2020-04-19 | Discharge: 2020-04-19 | Disposition: A | Discharge status: Home / Self Care | Payer: Medicare Other | Source: Ambulatory Visit | Attending: Radiology | Admitting: Radiology

## 2020-04-19 DIAGNOSIS — E041 Nontoxic single thyroid nodule: Secondary | ICD-10-CM | POA: Diagnosis not present

## 2020-04-22 LAB — CYTOLOGY - NON PAP

## 2020-04-27 DIAGNOSIS — R9389 Abnormal findings on diagnostic imaging of other specified body structures: Secondary | ICD-10-CM | POA: Diagnosis not present

## 2020-04-27 DIAGNOSIS — N950 Postmenopausal bleeding: Secondary | ICD-10-CM | POA: Diagnosis not present

## 2020-04-27 DIAGNOSIS — N882 Stricture and stenosis of cervix uteri: Secondary | ICD-10-CM | POA: Diagnosis not present

## 2020-05-04 DIAGNOSIS — E041 Nontoxic single thyroid nodule: Secondary | ICD-10-CM | POA: Diagnosis not present

## 2020-05-16 DIAGNOSIS — R9389 Abnormal findings on diagnostic imaging of other specified body structures: Secondary | ICD-10-CM | POA: Diagnosis not present

## 2020-05-16 DIAGNOSIS — N8500 Endometrial hyperplasia, unspecified: Secondary | ICD-10-CM | POA: Diagnosis not present

## 2020-05-16 DIAGNOSIS — N882 Stricture and stenosis of cervix uteri: Secondary | ICD-10-CM | POA: Diagnosis not present

## 2020-06-02 DIAGNOSIS — Z20822 Contact with and (suspected) exposure to covid-19: Secondary | ICD-10-CM | POA: Diagnosis not present

## 2020-06-20 ENCOUNTER — Other Ambulatory Visit: Payer: Self-pay

## 2020-06-20 ENCOUNTER — Encounter: Payer: Self-pay | Admitting: Dermatology

## 2020-06-20 ENCOUNTER — Ambulatory Visit (INDEPENDENT_AMBULATORY_CARE_PROVIDER_SITE_OTHER): Payer: Medicare Other | Admitting: Dermatology

## 2020-06-20 DIAGNOSIS — L578 Other skin changes due to chronic exposure to nonionizing radiation: Secondary | ICD-10-CM

## 2020-06-20 DIAGNOSIS — D229 Melanocytic nevi, unspecified: Secondary | ICD-10-CM

## 2020-06-20 DIAGNOSIS — L820 Inflamed seborrheic keratosis: Secondary | ICD-10-CM | POA: Diagnosis not present

## 2020-06-20 DIAGNOSIS — Z86018 Personal history of other benign neoplasm: Secondary | ICD-10-CM | POA: Diagnosis not present

## 2020-06-20 DIAGNOSIS — L814 Other melanin hyperpigmentation: Secondary | ICD-10-CM

## 2020-06-20 DIAGNOSIS — D1800 Hemangioma unspecified site: Secondary | ICD-10-CM | POA: Diagnosis not present

## 2020-06-20 DIAGNOSIS — Z85828 Personal history of other malignant neoplasm of skin: Secondary | ICD-10-CM

## 2020-06-20 DIAGNOSIS — L821 Other seborrheic keratosis: Secondary | ICD-10-CM

## 2020-06-20 DIAGNOSIS — Z1283 Encounter for screening for malignant neoplasm of skin: Secondary | ICD-10-CM

## 2020-06-20 NOTE — Patient Instructions (Signed)

## 2020-06-20 NOTE — Progress Notes (Signed)
Follow-Up Visit   Subjective  Monica Franklin is a 70 y.o. female who presents for the following: Annual Exam (Patient here today for annual exam. She has history of skin cancer. SCC on left leg. She has a spot on her chest and right cheek. ). The patient presents for Total-Body Skin Exam (TBSE) for skin cancer screening and mole check.  The following portions of the chart were reviewed this encounter and updated as appropriate:   Tobacco  Allergies  Meds  Problems  Med Hx  Surg Hx  Fam Hx     Review of Systems:  No other skin or systemic complaints except as noted in HPI or Assessment and Plan.  Objective  Well appearing patient in no apparent distress; mood and affect are within normal limits.  A full examination was performed including scalp, head, eyes, ears, nose, lips, neck, chest, axillae, abdomen, back, buttocks, bilateral upper extremities, bilateral lower extremities, hands, feet, fingers, toes, fingernails, and toenails. All findings within normal limits unless otherwise noted below.  Objective  right mandible x 1 chest x 1, left lateral knee x 1 (3): Erythematous keratotic or waxy stuck-on papule or plaque.    Assessment & Plan  Inflamed seborrheic keratosis (3) right mandible x 1 chest x 1, left lateral knee x 1  Destruction of lesion - right mandible x 1 chest x 1, left lateral knee x 1 Complexity: simple   Destruction method: cryotherapy   Informed consent: discussed and consent obtained   Timeout:  patient name, date of birth, surgical site, and procedure verified Lesion destroyed using liquid nitrogen: Yes   Region frozen until ice ball extended beyond lesion: Yes   Outcome: patient tolerated procedure well with no complications   Post-procedure details: wound care instructions given     Lentigines - Scattered tan macules - Discussed due to sun exposure - Benign, observe - Call for any changes  Seborrheic Keratoses - Stuck-on, waxy, tan-brown  papules and plaques  - Discussed benign etiology and prognosis. - Observe - Call for any changes  Melanocytic Nevi - Tan-brown and/or pink-flesh-colored symmetric macules and papules - Benign appearing on exam today - Observation - Call clinic for new or changing moles - Recommend daily use of broad spectrum spf 30+ sunscreen to sun-exposed areas.   Hemangiomas - Red papules - Discussed benign nature - Observe - Call for any changes  Actinic Damage - Chronic, secondary to cumulative UV/sun exposure - diffuse scaly erythematous macules with underlying dyspigmentation - Recommend daily broad spectrum sunscreen SPF 30+ to sun-exposed areas, reapply every 2 hours as needed.  - Call for new or changing lesions.  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  - Call if any new or changing lesions are noted between office visits  History of Dysplastic Nevi Left buttock - clear  - No evidence of recurrence today - Recommend regular full body skin exams - Recommend daily broad spectrum sunscreen SPF 30+ to sun-exposed areas, reapply every 2 hours as needed.  - Call if any new or changing lesions are noted between office visits  Skin cancer screening performed today.  No follow-ups on file.  Pt may be moving to [place], [place], so will follow up with a dermatologist there.  I, Melissa  Redmond Pulling, CMA, am acting as scribe for Sarina Ser, MD.  Documentation: I have reviewed the above documentation for accuracy and completeness, and I agree with the above.  Sarina Ser, MD

## 2020-06-21 ENCOUNTER — Encounter: Payer: Self-pay | Admitting: Dermatology

## 2020-07-29 DIAGNOSIS — E7800 Pure hypercholesterolemia, unspecified: Secondary | ICD-10-CM | POA: Diagnosis not present

## 2020-07-29 DIAGNOSIS — Z0000 Encounter for general adult medical examination without abnormal findings: Secondary | ICD-10-CM | POA: Diagnosis not present

## 2020-07-29 DIAGNOSIS — I700 Atherosclerosis of aorta: Secondary | ICD-10-CM | POA: Diagnosis not present

## 2020-07-29 DIAGNOSIS — M8588 Other specified disorders of bone density and structure, other site: Secondary | ICD-10-CM | POA: Diagnosis not present

## 2020-07-29 DIAGNOSIS — F439 Reaction to severe stress, unspecified: Secondary | ICD-10-CM | POA: Diagnosis not present

## 2020-07-29 DIAGNOSIS — E041 Nontoxic single thyroid nodule: Secondary | ICD-10-CM | POA: Diagnosis not present

## 2020-07-29 DIAGNOSIS — Z1211 Encounter for screening for malignant neoplasm of colon: Secondary | ICD-10-CM | POA: Diagnosis not present

## 2020-07-29 DIAGNOSIS — R197 Diarrhea, unspecified: Secondary | ICD-10-CM | POA: Diagnosis not present

## 2020-07-29 DIAGNOSIS — Z1389 Encounter for screening for other disorder: Secondary | ICD-10-CM | POA: Diagnosis not present

## 2020-08-02 ENCOUNTER — Telehealth: Payer: Self-pay | Admitting: Internal Medicine

## 2020-08-02 NOTE — Telephone Encounter (Signed)
Hey Dr Carlean Purl, this pt is being referred to [place] from Dr Antony Contras for second opinion regarding ongoing diarrhea, but pt is looking to transfer her GI care to [place], pt last saw Dr Watt Climes on 02/2020, notes are in epic for review, please advise on scheduling.

## 2020-08-08 NOTE — Telephone Encounter (Signed)
Monica Franklin,  Records reviewed.  Note that there are partial records because she is in [place] patient I can see also we will need to request her records but I am happy to see her and you may schedule before we get the records.   It is okay to use a follow-up slot if needed

## 2020-08-11 NOTE — Telephone Encounter (Signed)
Spoke with pt, she states she will be moving at the end of June, informed pt the original referral was for a second opinion which our providers do not do. She states she will seek GI care elsewhere.

## 2020-08-16 DIAGNOSIS — H5203 Hypermetropia, bilateral: Secondary | ICD-10-CM | POA: Diagnosis not present

## 2020-08-16 DIAGNOSIS — H2513 Age-related nuclear cataract, bilateral: Secondary | ICD-10-CM | POA: Diagnosis not present

## 2020-09-27 DIAGNOSIS — K52832 Lymphocytic colitis: Secondary | ICD-10-CM | POA: Diagnosis not present

## 2020-09-27 DIAGNOSIS — R634 Abnormal weight loss: Secondary | ICD-10-CM | POA: Diagnosis not present

## 2020-09-27 DIAGNOSIS — K529 Noninfective gastroenteritis and colitis, unspecified: Secondary | ICD-10-CM | POA: Diagnosis not present

## 2020-09-27 DIAGNOSIS — R110 Nausea: Secondary | ICD-10-CM | POA: Diagnosis not present

## 2020-10-07 DIAGNOSIS — Z1211 Encounter for screening for malignant neoplasm of colon: Secondary | ICD-10-CM | POA: Diagnosis not present

## 2020-10-07 DIAGNOSIS — R634 Abnormal weight loss: Secondary | ICD-10-CM | POA: Diagnosis not present

## 2020-10-07 DIAGNOSIS — Z01818 Encounter for other preprocedural examination: Secondary | ICD-10-CM | POA: Diagnosis not present

## 2020-10-10 DIAGNOSIS — Z01818 Encounter for other preprocedural examination: Secondary | ICD-10-CM | POA: Diagnosis not present

## 2020-10-10 DIAGNOSIS — K529 Noninfective gastroenteritis and colitis, unspecified: Secondary | ICD-10-CM | POA: Diagnosis not present

## 2020-10-10 DIAGNOSIS — Z1211 Encounter for screening for malignant neoplasm of colon: Secondary | ICD-10-CM | POA: Diagnosis not present

## 2020-10-18 DIAGNOSIS — A048 Other specified bacterial intestinal infections: Secondary | ICD-10-CM | POA: Diagnosis not present

## 2020-10-20 DIAGNOSIS — K529 Noninfective gastroenteritis and colitis, unspecified: Secondary | ICD-10-CM | POA: Diagnosis not present

## 2020-10-24 DIAGNOSIS — K635 Polyp of colon: Secondary | ICD-10-CM | POA: Diagnosis not present

## 2020-11-08 DIAGNOSIS — K529 Noninfective gastroenteritis and colitis, unspecified: Secondary | ICD-10-CM | POA: Diagnosis not present

## 2020-11-08 DIAGNOSIS — K31A0 Gastric intestinal metaplasia, unspecified: Secondary | ICD-10-CM | POA: Diagnosis not present

## 2021-08-11 IMAGING — CT CT CERVICAL SPINE W/O CM
3 of 4 series · 11 of 35 positions shown, 13 images · non-contrast
Comparison: None.

CLINICAL DATA: Syncopal event during yard work, fall to left
shoulder, now with neck pain

EXAM:
CT HEAD WITHOUT CONTRAST
CT CERVICAL SPINE WITHOUT CONTRAST
TECHNIQUE: Multidetector CT imaging of the head and cervical spine was
performed following the standard protocol without intravenous
contrast. Multiplanar CT image reconstructions of the cervical spine
were also generated.

[Series 7: sag bone · sagittal · 0.29mm/px · 5 of 75 slices shown, 6 images]
[im 25/75  bone]
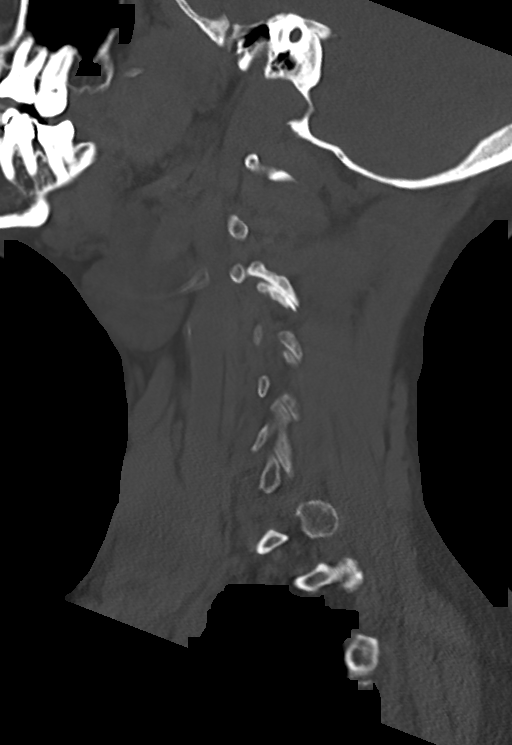
[im 31/75  bone]
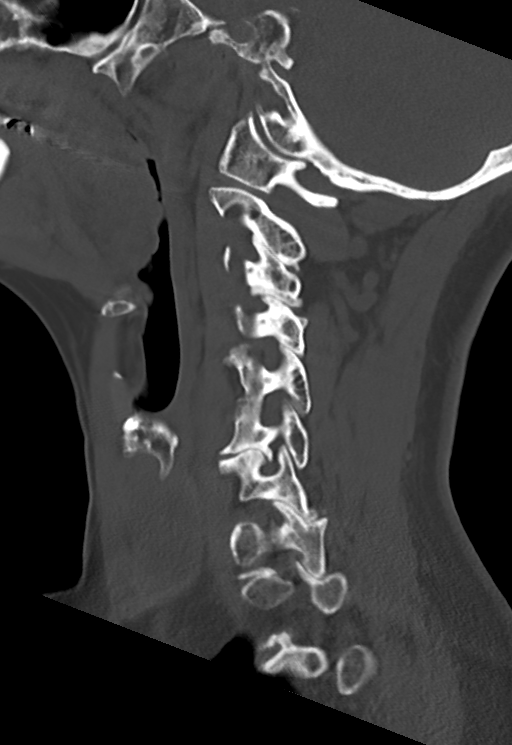
[im 38/75  soft-tissue]
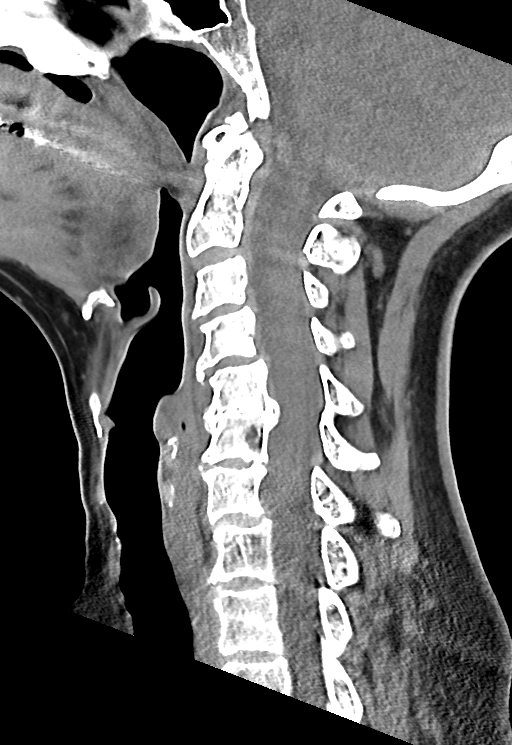
[im 38/75  bone]
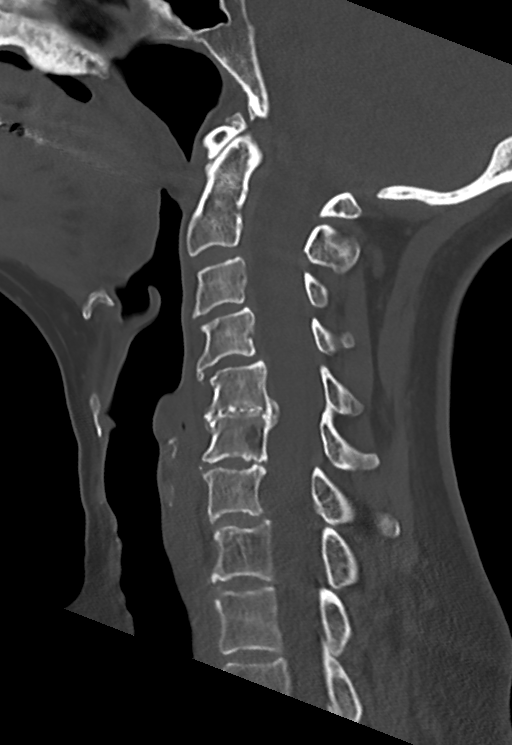
[im 44/75  bone]
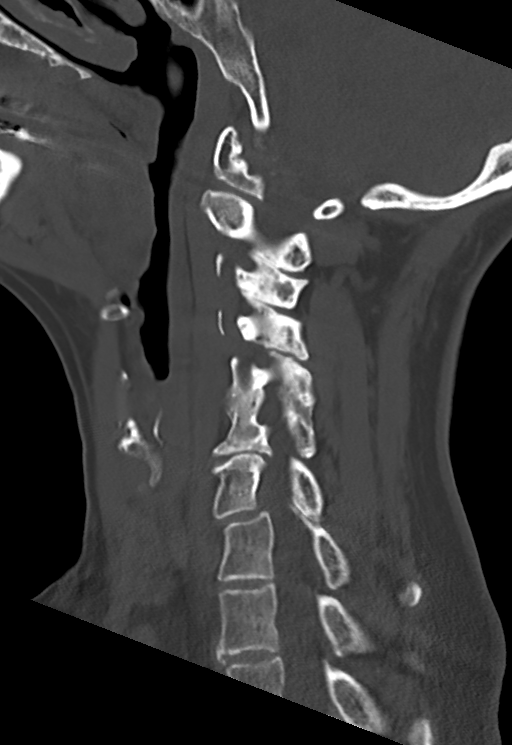
[im 50/75  bone]
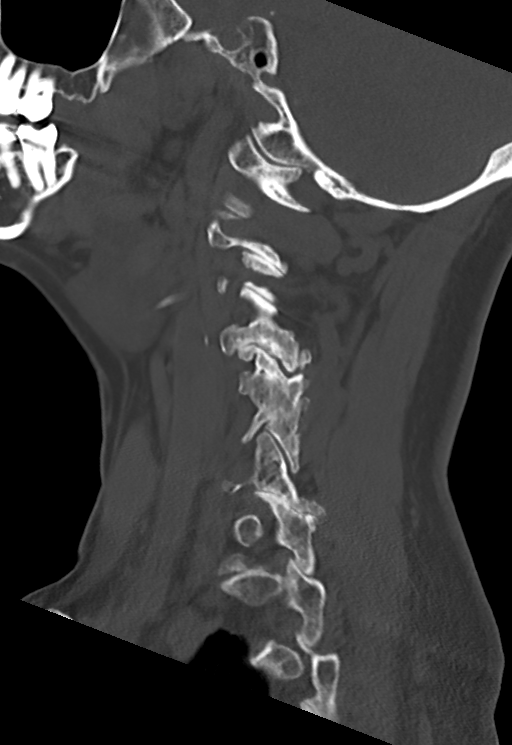

[Series 8: cor bone · coronal · 0.30mm/px · 3 of 67 slices shown]
[im 16/67  bone]
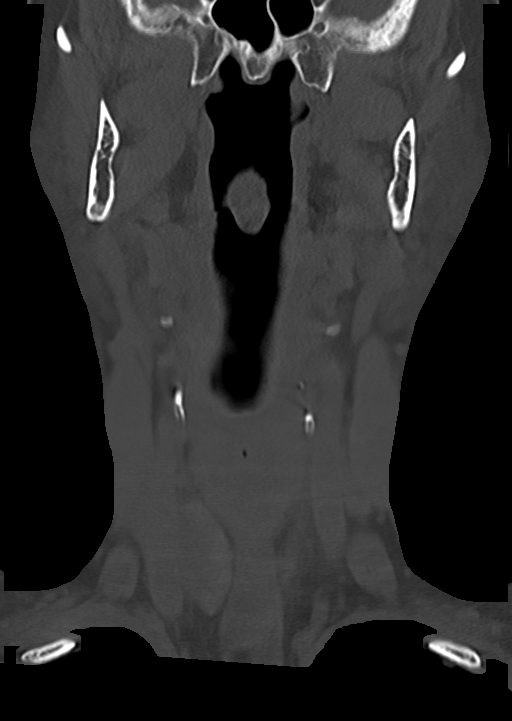
[im 28/67  bone]
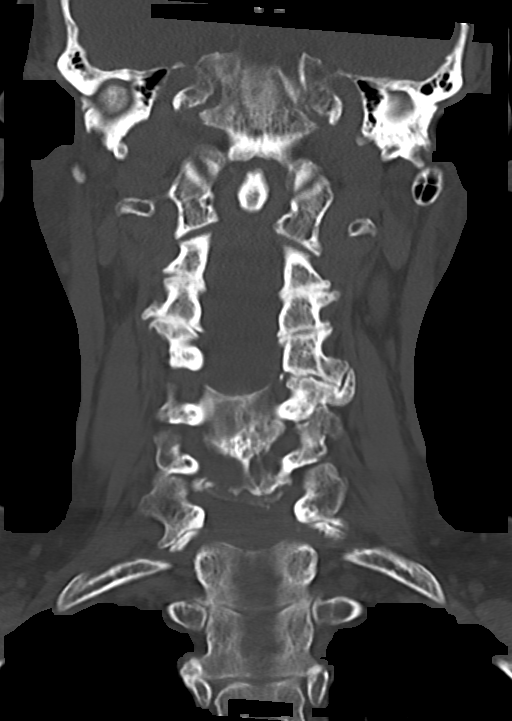
[im 40/67  bone]
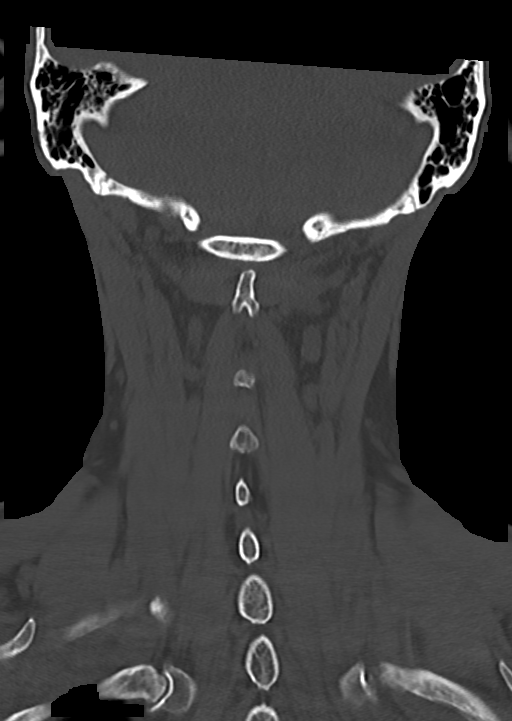

[Series 10: orthogonal axials · axial · 0.21mm/px · z∈[-291,-210]mm · 3 of 76 slices shown, 4 images]
[im 16/76  soft-tissue]
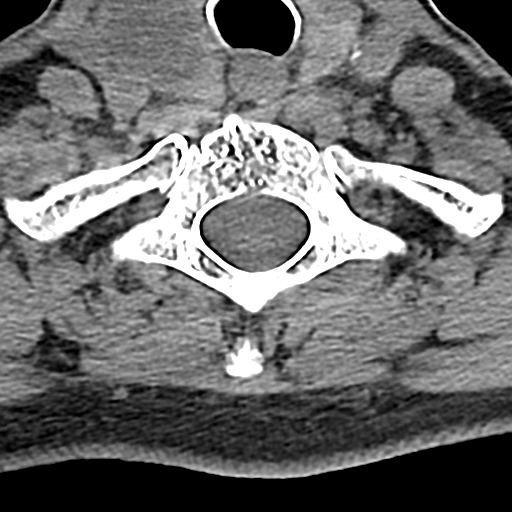
[im 16/76  bone]
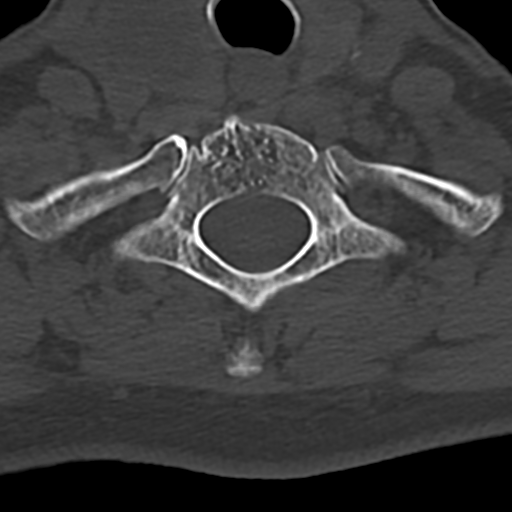
[im 46/76  bone]
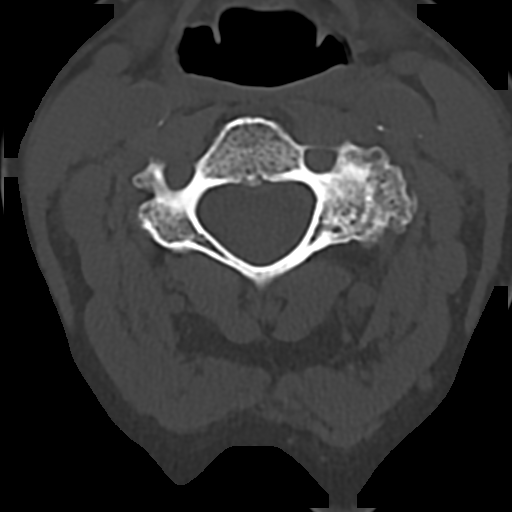
[im 61/76  bone]
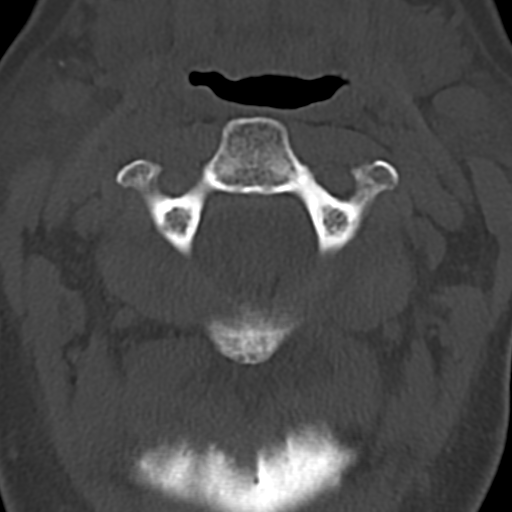

[11 of 35 positions shown; findings below may reference images not displayed]

FINDINGS: CT HEAD FINDINGS

Brain: No evidence of acute infarction, hemorrhage, hydrocephalus,
extra-axial collection or mass lesion/mass effect. Symmetric
prominence of the ventricles, cisterns and sulci compatible with
mild senescent parenchymal volume loss. Patchy areas of white matter
hypoattenuation are most compatible with chronic microvascular
angiopathy.

Vascular: Atherosclerotic calcification of the carotid siphons. No
hyperdense vessel.

Skull: Left frontal scalp laceration with small amount of soft
tissue gas with associated thickening and contusive change and thin
crescentic hematoma measuring up to 3 mm in maximal thickness. No
subjacent calvarial fracture. Additional laceration and soft tissue
gas extending across the left lateral periorbital soft tissues and
malar tissues with punctate radiodensity ([DATE]) possibly reflecting
some debris. No other acute osseous injuries of the calvaria or
included facial bones. Benign hyperostosis frontalis interna.

Sinuses/Orbits: Small osteoma in the left sphenoid sinus, typically
benign incidental finding. Paranasal sinuses are otherwise
predominantly clear. No retro septal gas, stranding or hemorrhage.
The globes appear normal and symmetric. Lenses are orthotopic.
Symmetric appearance of the extraocular musculature and optic nerve
sheath complexes. Normal caliber of the superior ophthalmic veins.

Other: None

CT CERVICAL SPINE FINDINGS

Alignment: Stabilization collar is absent at the time of
examination. Mild left neck flexion. Straightening and mild focal
reversal of the cervical lordosis at C5-6 with bony fusion of the
vertebral bodies and left posterior elements. Mild stepwise
anterolisthesis C2-C5 and C7-T1 are favored to be on a degenerative
basis given advanced facet arthropathy at these levels. No evidence
of traumatic listhesis. No abnormally widened, perched or jumped
facets. Normal alignment of the craniocervical and atlantoaxial
articulations.

Skull base and vertebrae: No acute skull base fracture. No vertebral
body fractures or height loss. Bony fusion of the C5-6 vertebrae and
left facets, as above. Benign hemangiomata seen at C6 and T1. No
worrisome osseous lesions.

Soft tissues and spinal canal: No pre or paravertebral fluid or
swelling. No visible canal hematoma.

Disc levels: Multilevel intervertebral disc height loss with
spondylitic endplate changes. Large posterior ridge at the fused
C5-6 levels results in effacement the ventral thecal sac and mild
indentation of the ventral cord compatible with at least moderate
stenosis. Additional disc osteophyte complex C6-7 results in some
mild canal stenosis as well. Multilevel uncinate spurring and facet
hypertrophic changes result in mild-to-moderate bilateral foraminal
narrowing more pronounced at the C6-7 levels where changes are more
moderate to severe.

Upper chest: No acute abnormality in the upper chest or imaged lung
apices. Biapical pleuroparenchymal scarring. Bubbly lucencies likely
reflecting small amount of paraseptal emphysematous change in the
medial left apex.

Other: 2.3 cm hypoattenuating nodule in the right lobe thyroid
gland. Cervical carotid atherosclerosis.
IMPRESSION: 1. No acute intracranial abnormality.
2. Left frontal scalp contusion and laceration with small amount of
soft tissue gas and thin crescentic hematoma measuring up to 3 mm in
maximal thickness. No subjacent calvarial fracture.
3. Additional laceration and soft tissue gas extending across the
left lateral periorbital soft tissues and malar tissues with
punctate radiodensity possibly reflecting some debris.
4. No other acute osseous injuries of the calvaria or included
facial bones.
5. No acute cervical spine fracture or traumatic listhesis.
6. Bony fusion of the C5-6 vertebrae and left posterior elements.
7. Multilevel degenerative changes of the cervical spine, as
described above.
8. 2.3 cm hypoattenuating nodule in the right lobe thyroid gland.
Recommend further evaluation with thyroid ultrasound on a
nonemergent basis. This follows consensus guidelines: Managing
Incidental Thyroid Nodules Detected on Imaging: White Paper of [REDACTED]. [HOSPITAL] 0667;
[DATE]. and [employer] 3-tiered system for managing ITNs: [HOSPITAL]. [DATE]): 143-50
9. Cervical and intracranial atherosclerosis.
